# Patient Record
Sex: Female | Born: 1945 | Race: White | Hispanic: No | Marital: Married | Smoking: Former smoker
Health system: Southern US, Community
[De-identification: ages and names within clinical notes are randomized; demographics above are authoritative.]

## PROBLEM LIST (undated history)

## (undated) DIAGNOSIS — D369 Benign neoplasm, unspecified site: Secondary | ICD-10-CM

## (undated) DIAGNOSIS — T7840XA Allergy, unspecified, initial encounter: Secondary | ICD-10-CM

## (undated) DIAGNOSIS — E785 Hyperlipidemia, unspecified: Secondary | ICD-10-CM

## (undated) DIAGNOSIS — D649 Anemia, unspecified: Secondary | ICD-10-CM

## (undated) DIAGNOSIS — I10 Essential (primary) hypertension: Secondary | ICD-10-CM

## (undated) DIAGNOSIS — Z8616 Personal history of COVID-19: Secondary | ICD-10-CM

## (undated) DIAGNOSIS — R7303 Prediabetes: Secondary | ICD-10-CM

## (undated) DIAGNOSIS — K219 Gastro-esophageal reflux disease without esophagitis: Secondary | ICD-10-CM

## (undated) DIAGNOSIS — Z780 Asymptomatic menopausal state: Secondary | ICD-10-CM

## (undated) DIAGNOSIS — M1990 Unspecified osteoarthritis, unspecified site: Secondary | ICD-10-CM

## (undated) DIAGNOSIS — Z86010 Personal history of colonic polyps: Secondary | ICD-10-CM

## (undated) DIAGNOSIS — K589 Irritable bowel syndrome without diarrhea: Secondary | ICD-10-CM

## (undated) HISTORY — PX: POLYPECTOMY: SHX149

## (undated) HISTORY — DX: Essential (primary) hypertension: I10

## (undated) HISTORY — PX: OTHER SURGICAL HISTORY: SHX169

## (undated) HISTORY — DX: Hyperlipidemia, unspecified: E78.5

## (undated) HISTORY — DX: Personal history of colonic polyps: Z86.010

## (undated) HISTORY — DX: Gastro-esophageal reflux disease without esophagitis: K21.9

## (undated) HISTORY — DX: Allergy, unspecified, initial encounter: T78.40XA

## (undated) HISTORY — DX: Irritable bowel syndrome without diarrhea: K58.9

## (undated) HISTORY — PX: CATARACT EXTRACTION, BILATERAL: SHX1313

## (undated) HISTORY — DX: Benign neoplasm, unspecified site: D36.9

## (undated) HISTORY — DX: Anemia, unspecified: D64.9

## (undated) HISTORY — DX: Unspecified osteoarthritis, unspecified site: M19.90

## (undated) HISTORY — DX: Asymptomatic menopausal state: Z78.0

## (undated) HISTORY — PX: CHOLECYSTECTOMY: SHX55

## (undated) HISTORY — PX: BREAST BIOPSY: SHX20

## (undated) HISTORY — PX: ABDOMINAL HYSTERECTOMY: SHX81

## (undated) HISTORY — PX: COLONOSCOPY: SHX174

---

## 2003-11-24 ENCOUNTER — Ambulatory Visit: Payer: Self-pay | Admitting: Family Medicine

## 2003-12-24 ENCOUNTER — Ambulatory Visit: Payer: Self-pay | Admitting: Family Medicine

## 2004-03-14 ENCOUNTER — Ambulatory Visit: Payer: Self-pay | Admitting: Family Medicine

## 2004-03-18 ENCOUNTER — Ambulatory Visit: Payer: Self-pay | Admitting: Family Medicine

## 2004-04-28 ENCOUNTER — Ambulatory Visit: Payer: Self-pay | Admitting: Family Medicine

## 2004-07-15 ENCOUNTER — Ambulatory Visit: Payer: Self-pay | Admitting: Family Medicine

## 2004-08-15 ENCOUNTER — Ambulatory Visit: Payer: Self-pay | Admitting: Family Medicine

## 2005-02-06 ENCOUNTER — Ambulatory Visit: Payer: Self-pay | Admitting: Family Medicine

## 2015-03-31 DIAGNOSIS — M1990 Unspecified osteoarthritis, unspecified site: Secondary | ICD-10-CM | POA: Insufficient documentation

## 2015-03-31 DIAGNOSIS — I10 Essential (primary) hypertension: Secondary | ICD-10-CM | POA: Insufficient documentation

## 2015-03-31 DIAGNOSIS — E785 Hyperlipidemia, unspecified: Secondary | ICD-10-CM | POA: Insufficient documentation

## 2015-03-31 DIAGNOSIS — R7301 Impaired fasting glucose: Secondary | ICD-10-CM | POA: Insufficient documentation

## 2015-03-31 DIAGNOSIS — E559 Vitamin D deficiency, unspecified: Secondary | ICD-10-CM | POA: Insufficient documentation

## 2015-03-31 DIAGNOSIS — K2100 Gastro-esophageal reflux disease with esophagitis, without bleeding: Secondary | ICD-10-CM | POA: Insufficient documentation

## 2016-01-17 HISTORY — PX: COLONOSCOPY: SHX174

## 2016-05-11 DIAGNOSIS — N393 Stress incontinence (female) (male): Secondary | ICD-10-CM | POA: Insufficient documentation

## 2017-03-21 DIAGNOSIS — M25552 Pain in left hip: Secondary | ICD-10-CM | POA: Insufficient documentation

## 2018-12-24 ENCOUNTER — Encounter: Payer: Self-pay | Admitting: Gastroenterology

## 2019-01-07 ENCOUNTER — Encounter: Payer: Self-pay | Admitting: Gastroenterology

## 2019-01-20 DIAGNOSIS — Z20828 Contact with and (suspected) exposure to other viral communicable diseases: Secondary | ICD-10-CM | POA: Diagnosis not present

## 2019-01-20 DIAGNOSIS — R5383 Other fatigue: Secondary | ICD-10-CM | POA: Diagnosis not present

## 2019-01-20 DIAGNOSIS — R519 Headache, unspecified: Secondary | ICD-10-CM | POA: Diagnosis not present

## 2019-01-20 DIAGNOSIS — J3489 Other specified disorders of nose and nasal sinuses: Secondary | ICD-10-CM | POA: Diagnosis not present

## 2019-02-04 ENCOUNTER — Encounter: Payer: Self-pay | Admitting: Gastroenterology

## 2019-03-06 ENCOUNTER — Other Ambulatory Visit: Payer: Self-pay

## 2019-03-06 ENCOUNTER — Ambulatory Visit (AMBULATORY_SURGERY_CENTER): Payer: Medicare PPO | Admitting: *Deleted

## 2019-03-06 VITALS — Ht 67.0 in | Wt 191.0 lb

## 2019-03-06 DIAGNOSIS — Z86010 Personal history of colonic polyps: Secondary | ICD-10-CM

## 2019-03-06 MED ORDER — SUPREP BOWEL PREP KIT 17.5-3.13-1.6 GM/177ML PO SOLN: 1.0000 | Freq: Once | ORAL | 0 refills | Status: AC

## 2019-03-06 NOTE — Progress Notes (Signed)
Completed covid vaccines 03-04-2019- will be 2 weeks 1 day prior to colon 3-17 - she will not require a pre screen covid test   Pt verified name, DOB, address and insurance during PV today. Pt mailed instruction packet to included paper to complete and mail back to [facility] with addressed and stamped envelope, Emmi video, copy of consent form to read and not return, and instructions. PV completed over the phone. Pt encouraged to call with questions or issues   No egg or soy allergy known to patient  issues with past sedation with any surgeries  or procedures- hard to wake post op  , no intubation problems  No diet pills per patient No home 02 use per patient  No blood thinners per patient  Pt states  issues with constipation - she had a fair prep -  She said dr Lyndel Safe told her that she was hard to clean out last colon so will do a  2 day prep today for colon 3-17 No A fib or A flutter  EMMI video sent to pt's e mail   Due to the COVID-19 pandemic we are asking patients to follow these guidelines. Please only bring one care partner. Please be aware that your care partner may wait in the car in the parking lot or if they feel like they will be too hot to wait in the car, they may wait in the lobby on the 4th floor. All care partners are required to wear a mask the entire time (we do not have any that we can provide them), they need to practice social distancing, and we will do a Covid check for all patient's and care partners when you arrive. Also we will check their temperature and your temperature. If the care partner waits in their car they need to stay in the parking lot the entire time and we will call them on their cell phone when the patient is ready for discharge so they can bring the car to the front of the building. Also all patient's will need to wear a mask into building.

## 2019-03-12 ENCOUNTER — Encounter: Payer: Self-pay | Admitting: Gastroenterology

## 2019-03-19 ENCOUNTER — Other Ambulatory Visit: Payer: Self-pay

## 2019-03-19 ENCOUNTER — Encounter: Payer: Self-pay | Admitting: Gastroenterology

## 2019-03-19 ENCOUNTER — Ambulatory Visit (AMBULATORY_SURGERY_CENTER): Payer: Medicare PPO | Admitting: Gastroenterology

## 2019-03-19 VITALS — BP 120/56 | HR 50 | Temp 97.3°F | Resp 13 | Ht 67.0 in | Wt 191.0 lb

## 2019-03-19 DIAGNOSIS — Z1211 Encounter for screening for malignant neoplasm of colon: Secondary | ICD-10-CM | POA: Diagnosis not present

## 2019-03-19 DIAGNOSIS — D124 Benign neoplasm of descending colon: Secondary | ICD-10-CM

## 2019-03-19 DIAGNOSIS — I10 Essential (primary) hypertension: Secondary | ICD-10-CM | POA: Diagnosis not present

## 2019-03-19 DIAGNOSIS — Z86010 Personal history of colonic polyps: Secondary | ICD-10-CM | POA: Diagnosis not present

## 2019-03-19 DIAGNOSIS — D120 Benign neoplasm of cecum: Secondary | ICD-10-CM

## 2019-03-19 DIAGNOSIS — K589 Irritable bowel syndrome without diarrhea: Secondary | ICD-10-CM | POA: Diagnosis not present

## 2019-03-19 DIAGNOSIS — K219 Gastro-esophageal reflux disease without esophagitis: Secondary | ICD-10-CM | POA: Diagnosis not present

## 2019-03-19 DIAGNOSIS — D122 Benign neoplasm of ascending colon: Secondary | ICD-10-CM

## 2019-03-19 MED ORDER — SODIUM CHLORIDE 0.9 % IV SOLN: 500.0000 mL | Freq: Once | INTRAVENOUS | Status: DC

## 2019-03-19 NOTE — Op Note (Signed)
[facility] Patient Name: Amanda Merritt Procedure Date: 03/19/2019 11:00 AM MRN: [id number] Endoscopist: Jackquline Denmark , MD Age: 74 Referring MD:  Date of Birth: 08-02-1945 Gender: Female Account #: [id number] Procedure:                Colonoscopy Indications:              High risk colon cancer surveillance: Personal                            history of colonic polyps Medicines:                Monitored Anesthesia Care Procedure:                Pre-Anesthesia Assessment:                           - Prior to the procedure, a History and Physical                            was performed, and patient medications and                            allergies were reviewed. The patient's tolerance of                            previous anesthesia was also reviewed. The risks                            and benefits of the procedure and the sedation                            options and risks were discussed with the patient.                            All questions were answered, and informed consent                            was obtained. Prior Anticoagulants: The patient has                            taken no previous anticoagulant or antiplatelet                            agents. ASA Grade Assessment: II - A patient with                            mild systemic disease. After reviewing the risks                            and benefits, the patient was deemed in                            satisfactory condition to undergo the procedure.  After obtaining informed consent, the colonoscope                            was passed under direct vision. Throughout the                            procedure, the patient's blood pressure, pulse, and                            oxygen saturations were monitored continuously. The                            Colonoscope PCF H180 was introduced through the                            anus and advanced to the 2 cm into the  ileum. The                            colonoscopy was performed without difficulty. The                            patient tolerated the procedure well. The quality                            of the bowel preparation was good (2 day prep). The                            terminal ileum, ileocecal valve, appendiceal                            orifice, and rectum were photographed. Scope In: 11:09:52 AM Scope Out: 11:28:46 AM Scope Withdrawal Time: 0 hours 16 minutes 5 seconds  Total Procedure Duration: 0 hours 18 minutes 54 seconds  Findings:                 Four sessile polyps were found in the descending                            colon, ascending colon and cecum. The polyps were 6                            to 8 mm in size. These polyps were removed with a                            cold snare. Resection and retrieval were complete.                           Multiple small and large-mouthed diverticula were                            found in the sigmoid colon, few in descending colon  and ascending colon.                           Non-bleeding internal hemorrhoids were found during                            retroflexion. The hemorrhoids were small.                           The terminal ileum appeared normal.                           The exam was otherwise without abnormality on                            direct and retroflexion views. Complications:            No immediate complications. Estimated Blood Loss:     Estimated blood loss: none. Impression:               -Colonic polyps s/p polypectomy.                           -Pancolonic diverticulosis predominantly in the                            sigmoid colon.                           -Otherwise normal colonoscopy to TI. Of note that                            patient got 2-day prep. Recommendation:           - Patient has a contact number available for                            emergencies. The signs  and symptoms of potential                            delayed complications were discussed with the                            patient. Return to normal activities tomorrow.                            Written discharge instructions were provided to the                            patient.                           - High fiber diet.                           - Continue present medications.                           -  Await pathology results.                           - Repeat colonoscopy for surveillance based on                            pathology results.                           - Return to GI office PRN. Jackquline Denmark, MD 03/19/2019 11:34:39 AM This report has been signed electronically.

## 2019-03-19 NOTE — Patient Instructions (Signed)
YOU HAD AN ENDOSCOPIC PROCEDURE TODAY AT THE [facility]:   Refer to the procedure report that was given to you for any specific questions about what was found during the examination.  If the procedure report does not answer your questions, please call your gastroenterologist to clarify.  If you requested that your care partner not be given the details of your procedure findings, then the procedure report has been included in a sealed envelope for you to review at your convenience later.  YOU SHOULD EXPECT: Some feelings of bloating in the abdomen. Passage of more gas than usual.  Walking can help get rid of the air that was put into your GI tract during the procedure and reduce the bloating. If you had a lower endoscopy (such as a colonoscopy or flexible sigmoidoscopy) you may notice spotting of blood in your stool or on the toilet paper. If you underwent a bowel prep for your procedure, you may not have a normal bowel movement for a few days.  Please Note:  You might notice some irritation and congestion in your nose or some drainage.  This is from the oxygen used during your procedure.  There is no need for concern and it should clear up in a day or so.  SYMPTOMS TO REPORT IMMEDIATELY:   Following lower endoscopy (colonoscopy or flexible sigmoidoscopy):  Excessive amounts of blood in the stool  Significant tenderness or worsening of abdominal pains  Swelling of the abdomen that is new, acute  Fever of 100F or higher  For urgent or emergent issues, a gastroenterologist can be reached at any hour by calling [phone]. Do not use MyChart messaging for urgent concerns.    DIET:  We do recommend a small meal at first, but then you may proceed to your regular diet.  Drink plenty of fluids but you should avoid alcoholic beverages for 24 hours.  ACTIVITY:  You should plan to take it easy for the rest of today and you should NOT DRIVE or use heavy machinery until tomorrow (because  of the sedation medicines used during the test).    FOLLOW UP: Our staff will call the number listed on your records 48-72 hours following your procedure to check on you and address any questions or concerns that you may have regarding the information given to you following your procedure. If we do not reach you, we will leave a message.  We will attempt to reach you two times.  During this call, we will ask if you have developed any symptoms of COVID 19. If you develop any symptoms (ie: fever, flu-like symptoms, shortness of breath, cough etc.) before then, please call [phone].  If you test positive for Covid 19 in the 2 weeks post procedure, please call and report this information to us.    If any biopsies were taken you will be contacted by phone or by letter within the next 1-3 weeks.  Please call us at [phone] if you have not heard about the biopsies in 3 weeks.    SIGNATURES/CONFIDENTIALITY: You and/or your care partner have signed paperwork which will be entered into your electronic medical record.  These signatures attest to the fact that that the information above on your After Visit Summary has been reviewed and is understood.  Full responsibility of the confidentiality of this discharge information lies with you and/or your care-partner. 

## 2019-03-19 NOTE — Progress Notes (Signed)
Report given to PACU, vss 

## 2019-03-19 NOTE — Progress Notes (Signed)
Pt's states no medical or surgical changes since previsit or office visit. 

## 2019-03-19 NOTE — Progress Notes (Signed)
Called to room to assist during endoscopic procedure.  Patient ID and intended procedure confirmed with present staff. Received instructions for my participation in the procedure from the performing physician.  

## 2019-03-21 ENCOUNTER — Encounter: Payer: Self-pay | Admitting: Gastroenterology

## 2019-03-21 ENCOUNTER — Telehealth: Payer: Self-pay

## 2019-03-21 NOTE — Telephone Encounter (Signed)
  Follow up Call-  Call back number 03/19/2019  Post procedure Call Back phone  # [phone]  Permission to leave phone message Yes  Some recent data might be hidden     Patient questions:  Do you have a fever, pain , or abdominal swelling? No. Pain Score  0 *  Have you tolerated food without any problems? Yes.    Have you been able to return to your normal activities? Yes.    Do you have any questions about your discharge instructions: Diet   No. Medications  No. Follow up visit  No.  Do you have questions or concerns about your Care? No.  Actions: * If pain score is 4 or above: No action needed, pain <4.  1. Have you developed a fever since your procedure? no  2.   Have you had an respiratory symptoms (SOB or cough) since your procedure? no  3.   Have you tested positive for COVID 19 since your procedure no  4.   Have you had any family members/close contacts diagnosed with the COVID 19 since your procedure?  no   If yes to any of these questions please route to Joylene John, RN and Alphonsa Gin, Therapist, sports.

## 2019-04-14 DIAGNOSIS — R7309 Other abnormal glucose: Secondary | ICD-10-CM | POA: Diagnosis not present

## 2019-04-14 DIAGNOSIS — I10 Essential (primary) hypertension: Secondary | ICD-10-CM | POA: Diagnosis not present

## 2019-04-14 DIAGNOSIS — E785 Hyperlipidemia, unspecified: Secondary | ICD-10-CM | POA: Diagnosis not present

## 2019-04-14 DIAGNOSIS — Z0000 Encounter for general adult medical examination without abnormal findings: Secondary | ICD-10-CM | POA: Diagnosis not present

## 2019-04-21 DIAGNOSIS — M19019 Primary osteoarthritis, unspecified shoulder: Secondary | ICD-10-CM | POA: Diagnosis not present

## 2019-04-21 DIAGNOSIS — M5090 Cervical disc disorder, unspecified, unspecified cervical region: Secondary | ICD-10-CM | POA: Diagnosis not present

## 2019-04-21 DIAGNOSIS — R7309 Other abnormal glucose: Secondary | ICD-10-CM | POA: Diagnosis not present

## 2019-04-21 DIAGNOSIS — E785 Hyperlipidemia, unspecified: Secondary | ICD-10-CM | POA: Diagnosis not present

## 2019-04-21 DIAGNOSIS — Z0000 Encounter for general adult medical examination without abnormal findings: Secondary | ICD-10-CM | POA: Diagnosis not present

## 2019-04-21 DIAGNOSIS — K5900 Constipation, unspecified: Secondary | ICD-10-CM | POA: Diagnosis not present

## 2019-04-21 DIAGNOSIS — L989 Disorder of the skin and subcutaneous tissue, unspecified: Secondary | ICD-10-CM | POA: Diagnosis not present

## 2019-04-21 DIAGNOSIS — I10 Essential (primary) hypertension: Secondary | ICD-10-CM | POA: Diagnosis not present

## 2019-05-29 DIAGNOSIS — C44311 Basal cell carcinoma of skin of nose: Secondary | ICD-10-CM | POA: Diagnosis not present

## 2019-05-29 DIAGNOSIS — L821 Other seborrheic keratosis: Secondary | ICD-10-CM | POA: Diagnosis not present

## 2019-05-29 DIAGNOSIS — L578 Other skin changes due to chronic exposure to nonionizing radiation: Secondary | ICD-10-CM | POA: Diagnosis not present

## 2019-09-24 DIAGNOSIS — M5432 Sciatica, left side: Secondary | ICD-10-CM | POA: Diagnosis not present

## 2019-09-24 DIAGNOSIS — M5388 Other specified dorsopathies, sacral and sacrococcygeal region: Secondary | ICD-10-CM | POA: Diagnosis not present

## 2019-09-24 DIAGNOSIS — M9904 Segmental and somatic dysfunction of sacral region: Secondary | ICD-10-CM | POA: Diagnosis not present

## 2019-09-24 DIAGNOSIS — M9905 Segmental and somatic dysfunction of pelvic region: Secondary | ICD-10-CM | POA: Diagnosis not present

## 2019-09-24 DIAGNOSIS — M9903 Segmental and somatic dysfunction of lumbar region: Secondary | ICD-10-CM | POA: Diagnosis not present

## 2019-09-24 DIAGNOSIS — M4727 Other spondylosis with radiculopathy, lumbosacral region: Secondary | ICD-10-CM | POA: Diagnosis not present

## 2019-09-25 DIAGNOSIS — M4727 Other spondylosis with radiculopathy, lumbosacral region: Secondary | ICD-10-CM | POA: Diagnosis not present

## 2019-09-25 DIAGNOSIS — M5432 Sciatica, left side: Secondary | ICD-10-CM | POA: Diagnosis not present

## 2019-09-25 DIAGNOSIS — M9903 Segmental and somatic dysfunction of lumbar region: Secondary | ICD-10-CM | POA: Diagnosis not present

## 2019-09-25 DIAGNOSIS — M5388 Other specified dorsopathies, sacral and sacrococcygeal region: Secondary | ICD-10-CM | POA: Diagnosis not present

## 2019-09-25 DIAGNOSIS — M9904 Segmental and somatic dysfunction of sacral region: Secondary | ICD-10-CM | POA: Diagnosis not present

## 2019-09-25 DIAGNOSIS — M9905 Segmental and somatic dysfunction of pelvic region: Secondary | ICD-10-CM | POA: Diagnosis not present

## 2019-09-29 DIAGNOSIS — M9903 Segmental and somatic dysfunction of lumbar region: Secondary | ICD-10-CM | POA: Diagnosis not present

## 2019-09-29 DIAGNOSIS — M4727 Other spondylosis with radiculopathy, lumbosacral region: Secondary | ICD-10-CM | POA: Diagnosis not present

## 2019-09-29 DIAGNOSIS — M5388 Other specified dorsopathies, sacral and sacrococcygeal region: Secondary | ICD-10-CM | POA: Diagnosis not present

## 2019-09-29 DIAGNOSIS — M9904 Segmental and somatic dysfunction of sacral region: Secondary | ICD-10-CM | POA: Diagnosis not present

## 2019-09-29 DIAGNOSIS — M5432 Sciatica, left side: Secondary | ICD-10-CM | POA: Diagnosis not present

## 2019-09-29 DIAGNOSIS — M9905 Segmental and somatic dysfunction of pelvic region: Secondary | ICD-10-CM | POA: Diagnosis not present

## 2019-10-01 DIAGNOSIS — M9905 Segmental and somatic dysfunction of pelvic region: Secondary | ICD-10-CM | POA: Diagnosis not present

## 2019-10-01 DIAGNOSIS — M9903 Segmental and somatic dysfunction of lumbar region: Secondary | ICD-10-CM | POA: Diagnosis not present

## 2019-10-01 DIAGNOSIS — M5388 Other specified dorsopathies, sacral and sacrococcygeal region: Secondary | ICD-10-CM | POA: Diagnosis not present

## 2019-10-01 DIAGNOSIS — M4727 Other spondylosis with radiculopathy, lumbosacral region: Secondary | ICD-10-CM | POA: Diagnosis not present

## 2019-10-01 DIAGNOSIS — M5432 Sciatica, left side: Secondary | ICD-10-CM | POA: Diagnosis not present

## 2019-10-01 DIAGNOSIS — M9904 Segmental and somatic dysfunction of sacral region: Secondary | ICD-10-CM | POA: Diagnosis not present

## 2019-10-06 DIAGNOSIS — I10 Essential (primary) hypertension: Secondary | ICD-10-CM | POA: Diagnosis not present

## 2019-10-06 DIAGNOSIS — R7309 Other abnormal glucose: Secondary | ICD-10-CM | POA: Diagnosis not present

## 2019-10-06 DIAGNOSIS — E559 Vitamin D deficiency, unspecified: Secondary | ICD-10-CM | POA: Diagnosis not present

## 2019-10-06 DIAGNOSIS — E785 Hyperlipidemia, unspecified: Secondary | ICD-10-CM | POA: Diagnosis not present

## 2019-10-08 DIAGNOSIS — M9904 Segmental and somatic dysfunction of sacral region: Secondary | ICD-10-CM | POA: Diagnosis not present

## 2019-10-08 DIAGNOSIS — M9903 Segmental and somatic dysfunction of lumbar region: Secondary | ICD-10-CM | POA: Diagnosis not present

## 2019-10-08 DIAGNOSIS — M5432 Sciatica, left side: Secondary | ICD-10-CM | POA: Diagnosis not present

## 2019-10-08 DIAGNOSIS — M4727 Other spondylosis with radiculopathy, lumbosacral region: Secondary | ICD-10-CM | POA: Diagnosis not present

## 2019-10-08 DIAGNOSIS — M9905 Segmental and somatic dysfunction of pelvic region: Secondary | ICD-10-CM | POA: Diagnosis not present

## 2019-10-08 DIAGNOSIS — M5388 Other specified dorsopathies, sacral and sacrococcygeal region: Secondary | ICD-10-CM | POA: Diagnosis not present

## 2019-10-13 DIAGNOSIS — M5136 Other intervertebral disc degeneration, lumbar region: Secondary | ICD-10-CM | POA: Diagnosis not present

## 2019-10-13 DIAGNOSIS — M5090 Cervical disc disorder, unspecified, unspecified cervical region: Secondary | ICD-10-CM | POA: Diagnosis not present

## 2019-10-13 DIAGNOSIS — M5388 Other specified dorsopathies, sacral and sacrococcygeal region: Secondary | ICD-10-CM | POA: Diagnosis not present

## 2019-10-13 DIAGNOSIS — R7309 Other abnormal glucose: Secondary | ICD-10-CM | POA: Diagnosis not present

## 2019-10-13 DIAGNOSIS — K5900 Constipation, unspecified: Secondary | ICD-10-CM | POA: Diagnosis not present

## 2019-10-13 DIAGNOSIS — E785 Hyperlipidemia, unspecified: Secondary | ICD-10-CM | POA: Diagnosis not present

## 2019-10-13 DIAGNOSIS — M4727 Other spondylosis with radiculopathy, lumbosacral region: Secondary | ICD-10-CM | POA: Diagnosis not present

## 2019-10-13 DIAGNOSIS — M9904 Segmental and somatic dysfunction of sacral region: Secondary | ICD-10-CM | POA: Diagnosis not present

## 2019-10-13 DIAGNOSIS — E559 Vitamin D deficiency, unspecified: Secondary | ICD-10-CM | POA: Diagnosis not present

## 2019-10-13 DIAGNOSIS — M5410 Radiculopathy, site unspecified: Secondary | ICD-10-CM | POA: Diagnosis not present

## 2019-10-13 DIAGNOSIS — M19019 Primary osteoarthritis, unspecified shoulder: Secondary | ICD-10-CM | POA: Diagnosis not present

## 2019-10-13 DIAGNOSIS — I10 Essential (primary) hypertension: Secondary | ICD-10-CM | POA: Diagnosis not present

## 2019-10-13 DIAGNOSIS — M9905 Segmental and somatic dysfunction of pelvic region: Secondary | ICD-10-CM | POA: Diagnosis not present

## 2019-10-13 DIAGNOSIS — M5432 Sciatica, left side: Secondary | ICD-10-CM | POA: Diagnosis not present

## 2019-10-13 DIAGNOSIS — M9903 Segmental and somatic dysfunction of lumbar region: Secondary | ICD-10-CM | POA: Diagnosis not present

## 2019-10-14 DIAGNOSIS — Z1231 Encounter for screening mammogram for malignant neoplasm of breast: Secondary | ICD-10-CM | POA: Diagnosis not present

## 2019-10-16 DIAGNOSIS — M9905 Segmental and somatic dysfunction of pelvic region: Secondary | ICD-10-CM | POA: Diagnosis not present

## 2019-10-16 DIAGNOSIS — M5388 Other specified dorsopathies, sacral and sacrococcygeal region: Secondary | ICD-10-CM | POA: Diagnosis not present

## 2019-10-16 DIAGNOSIS — M9903 Segmental and somatic dysfunction of lumbar region: Secondary | ICD-10-CM | POA: Diagnosis not present

## 2019-10-16 DIAGNOSIS — M9904 Segmental and somatic dysfunction of sacral region: Secondary | ICD-10-CM | POA: Diagnosis not present

## 2019-10-16 DIAGNOSIS — M5432 Sciatica, left side: Secondary | ICD-10-CM | POA: Diagnosis not present

## 2019-10-16 DIAGNOSIS — M4727 Other spondylosis with radiculopathy, lumbosacral region: Secondary | ICD-10-CM | POA: Diagnosis not present

## 2019-12-16 DIAGNOSIS — H2512 Age-related nuclear cataract, left eye: Secondary | ICD-10-CM | POA: Diagnosis not present

## 2019-12-16 DIAGNOSIS — H18413 Arcus senilis, bilateral: Secondary | ICD-10-CM | POA: Diagnosis not present

## 2019-12-16 DIAGNOSIS — H25043 Posterior subcapsular polar age-related cataract, bilateral: Secondary | ICD-10-CM | POA: Diagnosis not present

## 2019-12-16 DIAGNOSIS — H35372 Puckering of macula, left eye: Secondary | ICD-10-CM | POA: Diagnosis not present

## 2019-12-16 DIAGNOSIS — H2513 Age-related nuclear cataract, bilateral: Secondary | ICD-10-CM | POA: Diagnosis not present

## 2019-12-16 DIAGNOSIS — H25013 Cortical age-related cataract, bilateral: Secondary | ICD-10-CM | POA: Diagnosis not present

## 2020-01-14 DIAGNOSIS — M19019 Primary osteoarthritis, unspecified shoulder: Secondary | ICD-10-CM | POA: Diagnosis not present

## 2020-01-14 DIAGNOSIS — I10 Essential (primary) hypertension: Secondary | ICD-10-CM | POA: Diagnosis not present

## 2020-01-14 DIAGNOSIS — M5090 Cervical disc disorder, unspecified, unspecified cervical region: Secondary | ICD-10-CM | POA: Diagnosis not present

## 2020-01-14 DIAGNOSIS — M47812 Spondylosis without myelopathy or radiculopathy, cervical region: Secondary | ICD-10-CM | POA: Diagnosis not present

## 2020-01-14 DIAGNOSIS — M19011 Primary osteoarthritis, right shoulder: Secondary | ICD-10-CM | POA: Diagnosis not present

## 2020-01-14 DIAGNOSIS — M79601 Pain in right arm: Secondary | ICD-10-CM | POA: Diagnosis not present

## 2020-01-14 DIAGNOSIS — M25511 Pain in right shoulder: Secondary | ICD-10-CM | POA: Diagnosis not present

## 2020-01-26 DIAGNOSIS — H2512 Age-related nuclear cataract, left eye: Secondary | ICD-10-CM | POA: Diagnosis not present

## 2020-01-27 DIAGNOSIS — M25511 Pain in right shoulder: Secondary | ICD-10-CM | POA: Diagnosis not present

## 2020-01-27 DIAGNOSIS — M19019 Primary osteoarthritis, unspecified shoulder: Secondary | ICD-10-CM | POA: Diagnosis not present

## 2020-01-27 DIAGNOSIS — H2511 Age-related nuclear cataract, right eye: Secondary | ICD-10-CM | POA: Diagnosis not present

## 2020-01-27 DIAGNOSIS — I10 Essential (primary) hypertension: Secondary | ICD-10-CM | POA: Diagnosis not present

## 2020-02-09 ENCOUNTER — Other Ambulatory Visit: Payer: Self-pay | Admitting: Family Medicine

## 2020-02-09 DIAGNOSIS — M25511 Pain in right shoulder: Secondary | ICD-10-CM

## 2020-02-09 DIAGNOSIS — M19019 Primary osteoarthritis, unspecified shoulder: Secondary | ICD-10-CM

## 2020-02-16 DIAGNOSIS — H2511 Age-related nuclear cataract, right eye: Secondary | ICD-10-CM | POA: Diagnosis not present

## 2020-03-02 ENCOUNTER — Other Ambulatory Visit: Payer: Medicare PPO

## 2020-03-05 ENCOUNTER — Other Ambulatory Visit: Payer: Medicare PPO

## 2020-03-20 ENCOUNTER — Ambulatory Visit
Admission: RE | Admit: 2020-03-20 | Discharge: 2020-03-20 | Disposition: A | Discharge status: Home / Self Care | Payer: Medicare PPO | Source: Ambulatory Visit | Attending: Family Medicine | Admitting: Family Medicine

## 2020-03-20 ENCOUNTER — Other Ambulatory Visit: Payer: Self-pay

## 2020-03-20 DIAGNOSIS — M19019 Primary osteoarthritis, unspecified shoulder: Secondary | ICD-10-CM

## 2020-03-20 DIAGNOSIS — S46011A Strain of muscle(s) and tendon(s) of the rotator cuff of right shoulder, initial encounter: Secondary | ICD-10-CM | POA: Diagnosis not present

## 2020-03-20 DIAGNOSIS — M25511 Pain in right shoulder: Secondary | ICD-10-CM

## 2020-03-20 MED ORDER — GADOBENATE DIMEGLUMINE 529 MG/ML IV SOLN
19.0000 mL | Freq: Once | INTRAVENOUS | Status: AC | PRN
  Administered 2020-03-20: 19 mL via INTRAVENOUS

## 2020-04-13 DIAGNOSIS — S46011A Strain of muscle(s) and tendon(s) of the rotator cuff of right shoulder, initial encounter: Secondary | ICD-10-CM | POA: Diagnosis not present

## 2020-04-16 DIAGNOSIS — E785 Hyperlipidemia, unspecified: Secondary | ICD-10-CM | POA: Diagnosis not present

## 2020-04-16 DIAGNOSIS — E559 Vitamin D deficiency, unspecified: Secondary | ICD-10-CM | POA: Diagnosis not present

## 2020-04-16 DIAGNOSIS — R7309 Other abnormal glucose: Secondary | ICD-10-CM | POA: Diagnosis not present

## 2020-04-16 DIAGNOSIS — I10 Essential (primary) hypertension: Secondary | ICD-10-CM | POA: Diagnosis not present

## 2020-04-16 DIAGNOSIS — R946 Abnormal results of thyroid function studies: Secondary | ICD-10-CM | POA: Diagnosis not present

## 2020-04-22 DIAGNOSIS — J3089 Other allergic rhinitis: Secondary | ICD-10-CM | POA: Diagnosis not present

## 2020-04-22 DIAGNOSIS — Z789 Other specified health status: Secondary | ICD-10-CM | POA: Diagnosis not present

## 2020-04-22 DIAGNOSIS — Z0000 Encounter for general adult medical examination without abnormal findings: Secondary | ICD-10-CM | POA: Diagnosis not present

## 2020-04-22 DIAGNOSIS — M7581 Other shoulder lesions, right shoulder: Secondary | ICD-10-CM | POA: Diagnosis not present

## 2020-04-22 DIAGNOSIS — M75100 Unspecified rotator cuff tear or rupture of unspecified shoulder, not specified as traumatic: Secondary | ICD-10-CM | POA: Diagnosis not present

## 2020-04-22 DIAGNOSIS — E785 Hyperlipidemia, unspecified: Secondary | ICD-10-CM | POA: Diagnosis not present

## 2020-04-22 DIAGNOSIS — R7309 Other abnormal glucose: Secondary | ICD-10-CM | POA: Diagnosis not present

## 2020-04-22 DIAGNOSIS — I10 Essential (primary) hypertension: Secondary | ICD-10-CM | POA: Diagnosis not present

## 2020-04-29 ENCOUNTER — Other Ambulatory Visit: Payer: Self-pay

## 2020-04-29 ENCOUNTER — Encounter (HOSPITAL_BASED_OUTPATIENT_CLINIC_OR_DEPARTMENT_OTHER): Payer: Self-pay | Admitting: Orthopaedic Surgery

## 2020-05-03 ENCOUNTER — Encounter (HOSPITAL_BASED_OUTPATIENT_CLINIC_OR_DEPARTMENT_OTHER)
Admission: RE | Admit: 2020-05-03 | Discharge: 2020-05-03 | Disposition: A | Discharge status: Home / Self Care | Payer: Medicare PPO | Source: Ambulatory Visit | Attending: Orthopaedic Surgery | Admitting: Orthopaedic Surgery

## 2020-05-03 ENCOUNTER — Other Ambulatory Visit (HOSPITAL_COMMUNITY)
Admission: RE | Admit: 2020-05-03 | Discharge: 2020-05-03 | Disposition: A | Discharge status: Home / Self Care | Payer: Medicare PPO | Source: Ambulatory Visit | Attending: Orthopaedic Surgery | Admitting: Orthopaedic Surgery

## 2020-05-03 DIAGNOSIS — Z20822 Contact with and (suspected) exposure to covid-19: Secondary | ICD-10-CM | POA: Insufficient documentation

## 2020-05-03 DIAGNOSIS — Z888 Allergy status to other drugs, medicaments and biological substances status: Secondary | ICD-10-CM | POA: Diagnosis not present

## 2020-05-03 DIAGNOSIS — K582 Mixed irritable bowel syndrome: Secondary | ICD-10-CM | POA: Diagnosis not present

## 2020-05-03 DIAGNOSIS — Z87891 Personal history of nicotine dependence: Secondary | ICD-10-CM | POA: Diagnosis not present

## 2020-05-03 DIAGNOSIS — G8929 Other chronic pain: Secondary | ICD-10-CM | POA: Diagnosis not present

## 2020-05-03 DIAGNOSIS — M25511 Pain in right shoulder: Secondary | ICD-10-CM | POA: Diagnosis not present

## 2020-05-03 DIAGNOSIS — Z01810 Encounter for preprocedural cardiovascular examination: Secondary | ICD-10-CM | POA: Insufficient documentation

## 2020-05-03 DIAGNOSIS — I10 Essential (primary) hypertension: Secondary | ICD-10-CM | POA: Diagnosis not present

## 2020-05-03 DIAGNOSIS — Z800 Family history of malignant neoplasm of digestive organs: Secondary | ICD-10-CM | POA: Diagnosis not present

## 2020-05-03 DIAGNOSIS — Z8616 Personal history of COVID-19: Secondary | ICD-10-CM | POA: Diagnosis not present

## 2020-05-03 DIAGNOSIS — S46011A Strain of muscle(s) and tendon(s) of the rotator cuff of right shoulder, initial encounter: Secondary | ICD-10-CM | POA: Diagnosis not present

## 2020-05-03 DIAGNOSIS — Z79899 Other long term (current) drug therapy: Secondary | ICD-10-CM | POA: Diagnosis not present

## 2020-05-03 DIAGNOSIS — Z01812 Encounter for preprocedural laboratory examination: Secondary | ICD-10-CM | POA: Diagnosis not present

## 2020-05-03 DIAGNOSIS — Y939 Activity, unspecified: Secondary | ICD-10-CM | POA: Diagnosis not present

## 2020-05-03 DIAGNOSIS — M19011 Primary osteoarthritis, right shoulder: Secondary | ICD-10-CM | POA: Diagnosis not present

## 2020-05-03 DIAGNOSIS — K219 Gastro-esophageal reflux disease without esophagitis: Secondary | ICD-10-CM | POA: Diagnosis not present

## 2020-05-03 DIAGNOSIS — W19XXXA Unspecified fall, initial encounter: Secondary | ICD-10-CM | POA: Diagnosis not present

## 2020-05-03 DIAGNOSIS — R7303 Prediabetes: Secondary | ICD-10-CM | POA: Diagnosis not present

## 2020-05-03 DIAGNOSIS — Z885 Allergy status to narcotic agent status: Secondary | ICD-10-CM | POA: Diagnosis not present

## 2020-05-03 DIAGNOSIS — Z7982 Long term (current) use of aspirin: Secondary | ICD-10-CM | POA: Diagnosis not present

## 2020-05-03 DIAGNOSIS — Z803 Family history of malignant neoplasm of breast: Secondary | ICD-10-CM | POA: Diagnosis not present

## 2020-05-03 LAB — BASIC METABOLIC PANEL
Anion gap: 7 (ref 5–15)
BUN: 16 mg/dL (ref 8–23)
CO2: 29 mmol/L (ref 22–32)
Calcium: 9.4 mg/dL (ref 8.9–10.3)
Chloride: 101 mmol/L (ref 98–111)
Creatinine, Ser: 0.74 mg/dL (ref 0.44–1.00)
GFR, Estimated: >60 mL/min (ref >60)
Glucose, Bld: 118 mg/dL — ABNORMAL HIGH (ref 70–99)
Potassium: 3.8 mmol/L (ref 3.5–5.1)
Sodium: 137 mmol/L (ref 135–145)

## 2020-05-03 LAB — SURGICAL PCR SCREEN
MRSA, PCR: NEGATIVE
Staphylococcus aureus: POSITIVE — AB

## 2020-05-03 NOTE — Progress Notes (Signed)
      Enhanced Recovery after Surgery for Orthopedics Enhanced Recovery after Surgery is a protocol used to improve the stress on your body and your recovery after surgery.  Patient Instructions  . The night before surgery:  o No food after midnight. ONLY clear liquids after midnight  . The day of surgery (if you do NOT have diabetes):  o Drink ONE (1) Pre-Surgery Clear Ensure as directed.   o This drink was given to you during your hospital  pre-op appointment visit. o The pre-op nurse will instruct you on the time to drink the  Pre-Surgery Ensure depending on your surgery time. o Finish the drink at the designated time by the pre-op nurse.  o Nothing else to drink after completing the  Pre-Surgery Clear Ensure.  . The day of surgery (if you have diabetes): o Drink ONE (1) Gatorade 2 (G2) as directed. o This drink was given to you during your hospital  pre-op appointment visit.  o The pre-op nurse will instruct you on the time to drink the   Gatorade 2 (G2) depending on your surgery time. o Color of the Gatorade may vary. Red is not allowed. o Nothing else to drink after completing the  Gatorade 2 (G2).         If you have questions, please contact your surgeon's office. Surgical soap given with instructions, pt verbalized understanding. Benzoyl peroxide gel given with instructions, pt verbalized understanding. 

## 2020-05-04 LAB — SARS CORONAVIRUS 2 (TAT 6-24 HRS): SARS Coronavirus 2: NEGATIVE

## 2020-05-04 NOTE — Progress Notes (Signed)
Staph +, Left message for Sherri at Dr. Rich Fuchs office.

## 2020-05-05 ENCOUNTER — Ambulatory Visit (HOSPITAL_COMMUNITY): Payer: Medicare PPO

## 2020-05-05 ENCOUNTER — Ambulatory Visit (HOSPITAL_BASED_OUTPATIENT_CLINIC_OR_DEPARTMENT_OTHER)
Admission: RE | Admit: 2020-05-05 | Discharge: 2020-05-05 | Disposition: A | Discharge status: Home / Self Care | Payer: Medicare PPO | Attending: Orthopaedic Surgery | Admitting: Orthopaedic Surgery

## 2020-05-05 ENCOUNTER — Ambulatory Visit (HOSPITAL_BASED_OUTPATIENT_CLINIC_OR_DEPARTMENT_OTHER): Payer: Medicare PPO | Admitting: Certified Registered Nurse Anesthetist

## 2020-05-05 ENCOUNTER — Encounter (HOSPITAL_BASED_OUTPATIENT_CLINIC_OR_DEPARTMENT_OTHER)
Admission: RE | Disposition: A | Discharge status: Home / Self Care | Payer: Self-pay | Source: Home / Self Care | Attending: Orthopaedic Surgery

## 2020-05-05 ENCOUNTER — Encounter (HOSPITAL_BASED_OUTPATIENT_CLINIC_OR_DEPARTMENT_OTHER): Payer: Self-pay | Admitting: Orthopaedic Surgery

## 2020-05-05 ENCOUNTER — Other Ambulatory Visit: Payer: Self-pay

## 2020-05-05 DIAGNOSIS — M19011 Primary osteoarthritis, right shoulder: Secondary | ICD-10-CM | POA: Insufficient documentation

## 2020-05-05 DIAGNOSIS — Z87891 Personal history of nicotine dependence: Secondary | ICD-10-CM | POA: Insufficient documentation

## 2020-05-05 DIAGNOSIS — R7303 Prediabetes: Secondary | ICD-10-CM | POA: Insufficient documentation

## 2020-05-05 DIAGNOSIS — M25511 Pain in right shoulder: Secondary | ICD-10-CM | POA: Insufficient documentation

## 2020-05-05 DIAGNOSIS — Z79899 Other long term (current) drug therapy: Secondary | ICD-10-CM | POA: Insufficient documentation

## 2020-05-05 DIAGNOSIS — G8929 Other chronic pain: Secondary | ICD-10-CM | POA: Insufficient documentation

## 2020-05-05 DIAGNOSIS — W19XXXA Unspecified fall, initial encounter: Secondary | ICD-10-CM | POA: Insufficient documentation

## 2020-05-05 DIAGNOSIS — S46011A Strain of muscle(s) and tendon(s) of the rotator cuff of right shoulder, initial encounter: Secondary | ICD-10-CM | POA: Diagnosis not present

## 2020-05-05 DIAGNOSIS — Z471 Aftercare following joint replacement surgery: Secondary | ICD-10-CM | POA: Diagnosis not present

## 2020-05-05 DIAGNOSIS — Z96611 Presence of right artificial shoulder joint: Secondary | ICD-10-CM | POA: Diagnosis not present

## 2020-05-05 DIAGNOSIS — Z803 Family history of malignant neoplasm of breast: Secondary | ICD-10-CM | POA: Insufficient documentation

## 2020-05-05 DIAGNOSIS — Z09 Encounter for follow-up examination after completed treatment for conditions other than malignant neoplasm: Secondary | ICD-10-CM

## 2020-05-05 DIAGNOSIS — Z7982 Long term (current) use of aspirin: Secondary | ICD-10-CM | POA: Insufficient documentation

## 2020-05-05 DIAGNOSIS — Z8616 Personal history of COVID-19: Secondary | ICD-10-CM | POA: Insufficient documentation

## 2020-05-05 DIAGNOSIS — Z888 Allergy status to other drugs, medicaments and biological substances status: Secondary | ICD-10-CM | POA: Insufficient documentation

## 2020-05-05 DIAGNOSIS — I10 Essential (primary) hypertension: Secondary | ICD-10-CM | POA: Diagnosis not present

## 2020-05-05 DIAGNOSIS — Z885 Allergy status to narcotic agent status: Secondary | ICD-10-CM | POA: Insufficient documentation

## 2020-05-05 DIAGNOSIS — Y939 Activity, unspecified: Secondary | ICD-10-CM | POA: Insufficient documentation

## 2020-05-05 DIAGNOSIS — K582 Mixed irritable bowel syndrome: Secondary | ICD-10-CM | POA: Diagnosis not present

## 2020-05-05 DIAGNOSIS — G8918 Other acute postprocedural pain: Secondary | ICD-10-CM | POA: Diagnosis not present

## 2020-05-05 DIAGNOSIS — K219 Gastro-esophageal reflux disease without esophagitis: Secondary | ICD-10-CM | POA: Insufficient documentation

## 2020-05-05 DIAGNOSIS — E785 Hyperlipidemia, unspecified: Secondary | ICD-10-CM | POA: Diagnosis not present

## 2020-05-05 DIAGNOSIS — Z800 Family history of malignant neoplasm of digestive organs: Secondary | ICD-10-CM | POA: Insufficient documentation

## 2020-05-05 HISTORY — DX: Prediabetes: R73.03

## 2020-05-05 HISTORY — DX: Personal history of COVID-19: Z86.16

## 2020-05-05 HISTORY — PX: REVERSE SHOULDER ARTHROPLASTY: SHX5054

## 2020-05-05 SURGERY — ARTHROPLASTY, SHOULDER, TOTAL, REVERSE
Anesthesia: General | Site: Shoulder | Laterality: Right

## 2020-05-05 MED ORDER — TRANEXAMIC ACID-NACL 1000-0.7 MG/100ML-% IV SOLN
INTRAVENOUS | Status: AC
  Filled 2020-05-05: qty 100

## 2020-05-05 MED ORDER — PROMETHAZINE HCL 25 MG/ML IJ SOLN
6.2500 mg | INTRAMUSCULAR | Status: DC | PRN
  Administered 2020-05-05: 6.25 mg via INTRAVENOUS

## 2020-05-05 MED ORDER — LIDOCAINE HCL (CARDIAC) PF 100 MG/5ML IV SOSY
PREFILLED_SYRINGE | INTRAVENOUS | Status: DC | PRN
  Administered 2020-05-05: 80 mg via INTRAVENOUS

## 2020-05-05 MED ORDER — OMEPRAZOLE 20 MG PO CPDR: 20.0000 mg | DELAYED_RELEASE_CAPSULE | Freq: Every day | ORAL | 0 refills | Status: DC

## 2020-05-05 MED ORDER — FENTANYL CITRATE (PF) 100 MCG/2ML IJ SOLN: 25.0000 ug | INTRAMUSCULAR | Status: DC | PRN

## 2020-05-05 MED ORDER — VANCOMYCIN HCL IN DEXTROSE 1-5 GM/200ML-% IV SOLN
1000.0000 mg | INTRAVENOUS | Status: AC
  Administered 2020-05-05: 1000 mg via INTRAVENOUS

## 2020-05-05 MED ORDER — VANCOMYCIN HCL 1000 MG IV SOLR
INTRAVENOUS | Status: DC | PRN
  Administered 2020-05-05: 1000 mg via INTRAVENOUS

## 2020-05-05 MED ORDER — PHENYLEPHRINE HCL (PRESSORS) 10 MG/ML IV SOLN
INTRAVENOUS | Status: DC | PRN
  Administered 2020-05-05 (×3): 80 ug via INTRAVENOUS

## 2020-05-05 MED ORDER — SODIUM CHLORIDE 0.9 % IV SOLN
INTRAVENOUS | Status: AC | PRN
  Administered 2020-05-05: 1000 mL

## 2020-05-05 MED ORDER — ACETAMINOPHEN 500 MG PO TABS: 1000.0000 mg | ORAL_TABLET | Freq: Three times a day (TID) | ORAL | 0 refills | Status: AC

## 2020-05-05 MED ORDER — BUPIVACAINE LIPOSOME 1.3 % IJ SUSP
INTRAMUSCULAR | Status: DC | PRN
  Administered 2020-05-05: 10 mL via PERINEURAL

## 2020-05-05 MED ORDER — LACTATED RINGERS IV SOLN: INTRAVENOUS | Status: DC

## 2020-05-05 MED ORDER — PROPOFOL 10 MG/ML IV BOLUS
INTRAVENOUS | Status: DC | PRN
  Administered 2020-05-05: 150 mg via INTRAVENOUS

## 2020-05-05 MED ORDER — TRANEXAMIC ACID-NACL 1000-0.7 MG/100ML-% IV SOLN
1000.0000 mg | INTRAVENOUS | Status: AC
  Administered 2020-05-05: 1000 mg via INTRAVENOUS

## 2020-05-05 MED ORDER — AMISULPRIDE (ANTIEMETIC) 5 MG/2ML IV SOLN
INTRAVENOUS | Status: AC
  Filled 2020-05-05: qty 4

## 2020-05-05 MED ORDER — DEXAMETHASONE SODIUM PHOSPHATE 4 MG/ML IJ SOLN
INTRAMUSCULAR | Status: DC | PRN
  Administered 2020-05-05: 5 mg via INTRAVENOUS

## 2020-05-05 MED ORDER — ROCURONIUM BROMIDE 100 MG/10ML IV SOLN
INTRAVENOUS | Status: DC | PRN
  Administered 2020-05-05: 50 mg via INTRAVENOUS

## 2020-05-05 MED ORDER — LACTATED RINGERS IV BOLUS: 250.0000 mL | Freq: Once | INTRAVENOUS | Status: DC

## 2020-05-05 MED ORDER — ACETAMINOPHEN 500 MG PO TABS
1000.0000 mg | ORAL_TABLET | Freq: Once | ORAL | Status: AC
  Administered 2020-05-05: 1000 mg via ORAL

## 2020-05-05 MED ORDER — DEXAMETHASONE SODIUM PHOSPHATE 10 MG/ML IJ SOLN
INTRAMUSCULAR | Status: AC
  Filled 2020-05-05: qty 1

## 2020-05-05 MED ORDER — CEFAZOLIN SODIUM-DEXTROSE 2-4 GM/100ML-% IV SOLN
INTRAVENOUS | Status: AC
  Filled 2020-05-05: qty 100

## 2020-05-05 MED ORDER — PROPOFOL 10 MG/ML IV BOLUS
INTRAVENOUS | Status: AC
  Filled 2020-05-05: qty 20

## 2020-05-05 MED ORDER — OXYCODONE HCL 5 MG PO TABS: ORAL_TABLET | ORAL | 0 refills | Status: AC

## 2020-05-05 MED ORDER — ONDANSETRON HCL 4 MG PO TABS: 4.0000 mg | ORAL_TABLET | Freq: Three times a day (TID) | ORAL | 0 refills | Status: AC | PRN

## 2020-05-05 MED ORDER — ONDANSETRON HCL 4 MG/2ML IJ SOLN
INTRAMUSCULAR | Status: AC
  Filled 2020-05-05: qty 2

## 2020-05-05 MED ORDER — ROCURONIUM BROMIDE 10 MG/ML (PF) SYRINGE
PREFILLED_SYRINGE | INTRAVENOUS | Status: AC
  Filled 2020-05-05: qty 10

## 2020-05-05 MED ORDER — VANCOMYCIN HCL 1000 MG IV SOLR
INTRAVENOUS | Status: DC | PRN
  Administered 2020-05-05: 1000 mg

## 2020-05-05 MED ORDER — OXYCODONE HCL 5 MG/5ML PO SOLN: 5.0000 mg | Freq: Once | ORAL | Status: DC | PRN

## 2020-05-05 MED ORDER — ACETAMINOPHEN 500 MG PO TABS: 1000.0000 mg | ORAL_TABLET | Freq: Once | ORAL | Status: DC

## 2020-05-05 MED ORDER — OXYCODONE HCL 5 MG PO TABS: 5.0000 mg | ORAL_TABLET | Freq: Once | ORAL | Status: DC | PRN

## 2020-05-05 MED ORDER — FENTANYL CITRATE (PF) 100 MCG/2ML IJ SOLN
100.0000 ug | Freq: Once | INTRAMUSCULAR | Status: AC
Start: 2020-05-05 — End: 2020-05-05
  Administered 2020-05-05: 50 ug via INTRAVENOUS

## 2020-05-05 MED ORDER — EPHEDRINE SULFATE 50 MG/ML IJ SOLN
INTRAMUSCULAR | Status: DC | PRN
  Administered 2020-05-05: 10 mg via INTRAVENOUS

## 2020-05-05 MED ORDER — ONDANSETRON HCL 4 MG/2ML IJ SOLN
INTRAMUSCULAR | Status: DC | PRN
  Administered 2020-05-05: 4 mg via INTRAVENOUS

## 2020-05-05 MED ORDER — CEFAZOLIN SODIUM-DEXTROSE 2-4 GM/100ML-% IV SOLN
2.0000 g | INTRAVENOUS | Status: AC
  Administered 2020-05-05: 2 g via INTRAVENOUS

## 2020-05-05 MED ORDER — CELECOXIB 100 MG PO CAPS: 100.0000 mg | ORAL_CAPSULE | Freq: Two times a day (BID) | ORAL | 0 refills | Status: AC

## 2020-05-05 MED ORDER — MIDAZOLAM HCL 2 MG/2ML IJ SOLN
2.0000 mg | Freq: Once | INTRAMUSCULAR | Status: AC
  Administered 2020-05-05: 1 mg via INTRAVENOUS

## 2020-05-05 MED ORDER — PROMETHAZINE HCL 25 MG/ML IJ SOLN
INTRAMUSCULAR | Status: AC
  Filled 2020-05-05: qty 1

## 2020-05-05 MED ORDER — AMISULPRIDE (ANTIEMETIC) 5 MG/2ML IV SOLN
10.0000 mg | Freq: Once | INTRAVENOUS | Status: AC
  Administered 2020-05-05: 10 mg via INTRAVENOUS

## 2020-05-05 MED ORDER — BUPIVACAINE-EPINEPHRINE (PF) 0.5% -1:200000 IJ SOLN
INTRAMUSCULAR | Status: DC | PRN
  Administered 2020-05-05: 15 mL via PERINEURAL

## 2020-05-05 MED ORDER — ACETAMINOPHEN 500 MG PO TABS
ORAL_TABLET | ORAL | Status: AC
  Filled 2020-05-05: qty 2

## 2020-05-05 MED ORDER — ASPIRIN 81 MG PO CHEW: 81.0000 mg | CHEWABLE_TABLET | Freq: Two times a day (BID) | ORAL | 0 refills | Status: AC

## 2020-05-05 MED ORDER — LACTATED RINGERS IV BOLUS: 500.0000 mL | Freq: Once | INTRAVENOUS | Status: DC

## 2020-05-05 MED ORDER — LIDOCAINE 2% (20 MG/ML) 5 ML SYRINGE
INTRAMUSCULAR | Status: AC
  Filled 2020-05-05: qty 5

## 2020-05-05 MED ORDER — SUGAMMADEX SODIUM 200 MG/2ML IV SOLN
INTRAVENOUS | Status: DC | PRN
  Administered 2020-05-05: 200 mg via INTRAVENOUS

## 2020-05-05 MED ORDER — MIDAZOLAM HCL 2 MG/2ML IJ SOLN
INTRAMUSCULAR | Status: AC
  Filled 2020-05-05: qty 2

## 2020-05-05 MED ORDER — VANCOMYCIN HCL IN DEXTROSE 1-5 GM/200ML-% IV SOLN
INTRAVENOUS | Status: AC
  Filled 2020-05-05: qty 200

## 2020-05-05 MED ORDER — FENTANYL CITRATE (PF) 100 MCG/2ML IJ SOLN
INTRAMUSCULAR | Status: AC
  Filled 2020-05-05: qty 2

## 2020-05-05 SURGICAL SUPPLY — 73 items
AID PSTN UNV HD RSTRNT DISP (MISCELLANEOUS) ×1
APL PRP STRL LF DISP 70% ISPRP (MISCELLANEOUS) ×1
BASEPLATE GLENOSPHERE 25 STD (Miscellaneous) ×1 IMPLANT
BIT DRILL 3.2 PERIPHERAL SCREW (BIT) ×1 IMPLANT
BLADE HEX COATED 2.75 (ELECTRODE) IMPLANT
BLADE SAW SAG 73X25 THK (BLADE) ×1
BLADE SAW SGTL 73X25 THK (BLADE) ×1 IMPLANT
BLADE SURG 10 STRL SS (BLADE) IMPLANT
BLADE SURG 15 STRL LF DISP TIS (BLADE) IMPLANT
BLADE SURG 15 STRL SS (BLADE)
BNDG COHESIVE 4X5 TAN STRL (GAUZE/BANDAGES/DRESSINGS) IMPLANT
BSPLAT GLND STD 25 RVRS SHLDR (Miscellaneous) ×1 IMPLANT
CHLORAPREP W/TINT 26 (MISCELLANEOUS) ×2 IMPLANT
CLSR STERI-STRIP ANTIMIC 1/2X4 (GAUZE/BANDAGES/DRESSINGS) ×2 IMPLANT
COOLER ICEMAN CLASSIC (MISCELLANEOUS) ×2 IMPLANT
COVER BACK TABLE 60X90IN (DRAPES) ×2 IMPLANT
COVER MAYO STAND STRL (DRAPES) ×2 IMPLANT
COVER WAND RF STERILE (DRAPES) IMPLANT
DECANTER SPIKE VIAL GLASS SM (MISCELLANEOUS) IMPLANT
DRAPE IMP U-DRAPE 54X76 (DRAPES) ×1 IMPLANT
DRAPE INCISE IOBAN 66X45 STRL (DRAPES) ×2 IMPLANT
DRAPE U-SHAPE 76X120 STRL (DRAPES) ×4 IMPLANT
DRSG AQUACEL AG ADV 3.5X 6 (GAUZE/BANDAGES/DRESSINGS) ×2 IMPLANT
ELECT BLADE 4.0 EZ CLEAN MEGAD (MISCELLANEOUS) ×2
ELECT REM PT RETURN 9FT ADLT (ELECTROSURGICAL) ×2
ELECTRODE BLDE 4.0 EZ CLN MEGD (MISCELLANEOUS) ×1 IMPLANT
ELECTRODE REM PT RTRN 9FT ADLT (ELECTROSURGICAL) ×1 IMPLANT
GLENOSPHERE REV SHOULDER 36 (Joint) ×1 IMPLANT
GLOVE SRG 8 PF TXTR STRL LF DI (GLOVE) ×1 IMPLANT
GLOVE SURG ENC MOIS LTX SZ6.5 (GLOVE) ×6 IMPLANT
GLOVE SURG LTX SZ8 (GLOVE) ×3 IMPLANT
GLOVE SURG UNDER POLY LF SZ6.5 (GLOVE) ×2 IMPLANT
GLOVE SURG UNDER POLY LF SZ7 (GLOVE) ×3 IMPLANT
GLOVE SURG UNDER POLY LF SZ8 (GLOVE) ×2
GOWN STRL REUS W/ TWL LRG LVL3 (GOWN DISPOSABLE) ×2 IMPLANT
GOWN STRL REUS W/ TWL XL LVL3 (GOWN DISPOSABLE) IMPLANT
GOWN STRL REUS W/TWL LRG LVL3 (GOWN DISPOSABLE) ×4
GOWN STRL REUS W/TWL XL LVL3 (GOWN DISPOSABLE) ×4 IMPLANT
GUIDEWIRE GLENOID 2.5X220 (WIRE) ×1 IMPLANT
HANDPIECE INTERPULSE COAX TIP (DISPOSABLE) ×2
IMPL REVERSE SHOULDER 0X3.5 (Shoulder) IMPLANT
IMPLANT REVERSE SHOULDER 0X3.5 (Shoulder) ×2 IMPLANT
INSERT HUMERAL 36X6MM 12.5DEG (Insert) ×1 IMPLANT
KIT LEG STABILIZATION (KITS) ×1 IMPLANT
KIT STABILIZATION SHOULDER (MISCELLANEOUS) ×1 IMPLANT
MANIFOLD NEPTUNE II (INSTRUMENTS) ×2 IMPLANT
PACK BASIN DAY SURGERY FS (CUSTOM PROCEDURE TRAY) ×2 IMPLANT
PACK SHOULDER (CUSTOM PROCEDURE TRAY) ×2 IMPLANT
PAD COLD SHLDR UNI WRAP-ON (PAD) ×2
PAD COLD SHLDR WRAP-ON (PAD) ×1 IMPLANT
PAD COLD UNI WRAP-ON (PAD) IMPLANT
PAD ORTHO SHOULDER 7X19 LRG (SOFTGOODS) ×2 IMPLANT
PENCIL SMOKE EVACUATOR (MISCELLANEOUS) ×1 IMPLANT
RESTRAINT HEAD UNIVERSAL NS (MISCELLANEOUS) ×2 IMPLANT
SCREW 5.0X38 SMALL F/PERFORM (Screw) ×1 IMPLANT
SCREW 5.5X22 (Screw) ×1 IMPLANT
SCREW CENTRAL THREAD 6.5X45 (Screw) ×1 IMPLANT
SET HNDPC FAN SPRY TIP SCT (DISPOSABLE) ×1 IMPLANT
SHEET MEDIUM DRAPE 40X70 STRL (DRAPES) ×2 IMPLANT
SLEEVE SCD COMPRESS KNEE MED (STOCKING) ×2 IMPLANT
SPONGE LAP 18X18 RF (DISPOSABLE) IMPLANT
STEM HUMERAL SZ2B STND 70 PTC (Stem) ×2 IMPLANT
STEM HUMERAL SZ2BSTD 70 PTC (Stem) IMPLANT
SUT ETHIBOND 2 V 37 (SUTURE) ×2 IMPLANT
SUT ETHIBOND NAB CT1 #1 30IN (SUTURE) ×2 IMPLANT
SUT FIBERWIRE #5 38 CONV NDL (SUTURE) ×10
SUT MNCRL AB 4-0 PS2 18 (SUTURE) ×1 IMPLANT
SUT VIC AB 3-0 SH 27 (SUTURE) ×2
SUT VIC AB 3-0 SH 27X BRD (SUTURE) ×1 IMPLANT
SUTURE FIBERWR #5 38 CONV NDL (SUTURE) ×3 IMPLANT
SYR BULB IRRIG 60ML STRL (SYRINGE) IMPLANT
TOWEL GREEN STERILE FF (TOWEL DISPOSABLE) ×6 IMPLANT
TUBE SUCTION HIGH CAP CLEAR NV (SUCTIONS) ×2 IMPLANT

## 2020-05-05 NOTE — Anesthesia Postprocedure Evaluation (Signed)
Anesthesia Post Note  Patient: Amanda Merritt  Procedure(s) Performed: REVERSE SHOULDER ARTHROPLASTY (Right Shoulder)     Patient location during evaluation: PACU Anesthesia Type: General Level of consciousness: awake and alert and oriented Pain management: pain level controlled Vital Signs Assessment: post-procedure vital signs reviewed and stable Respiratory status: spontaneous breathing, nonlabored ventilation and respiratory function stable Cardiovascular status: blood pressure returned to baseline Postop Assessment: no apparent nausea or vomiting Anesthetic complications: no   No complications documented.  Last Vitals:  Vitals:   05/05/20 1500 05/05/20 1515  BP: (!) 149/65 (!) 143/59  Pulse: 67 67  Resp: 15 15  Temp:    SpO2: 98% 97%    Last Pain:  Vitals:   05/05/20 1500  TempSrc:   PainSc: 0-No pain                 Brennan Bailey

## 2020-05-05 NOTE — H&P (Signed)
PREOPERATIVE H&P  Chief Complaint: djd right shoulder  HPI: Amanda Merritt is a 75 y.o. female who is scheduled for Procedure(s): REVERSE SHOULDER ARTHROPLASTY.   Patient has a past medical history significant for HTN, GERD, IBS.   Patient is a 75 year-old female who has had acute on chronic pain in her right shoulder.  She has had injections in the past and physical therapy in the past for a presumed cuff tear.  She unfortunately had a fall and afterwards had a pop in her right shoulder.  She has had dysfunction in the shoulder since then.    Her symptoms are rated as moderate to severe, and have been worsening.  This is significantly impairing activities of daily living.    Please see clinic note for further details on this patient's care.    She has elected for surgical management.   Past Medical History:  Diagnosis Date  . Adenomatous polyps   . Allergy   . Anemia    with pregnancy   . Arthritis   . GERD (gastroesophageal reflux disease)   . History of COVID-19    received infusion therapy  . Hyperlipidemia   . Hypertension   . IBS (irritable bowel syndrome)    alternates with constipation and loose stools- takes fiber daily   . Prediabetes    controlled with diet and exercise   Past Surgical History:  Procedure Laterality Date  . ABDOMINAL HYSTERECTOMY     left ovaries   . bladder tac    . CATARACT EXTRACTION, BILATERAL    . CHOLECYSTECTOMY    . COLONOSCOPY    . POLYPECTOMY     Social History   Socioeconomic History  . Marital status: Married    Spouse name: Not on file  . Number of children: Not on file  . Years of education: Not on file  . Highest education level: Not on file  Occupational History  . Not on file  Tobacco Use  . Smoking status: Former Research scientist (life sciences)  . Smokeless tobacco: Never Used  . Tobacco comment: quit early 20's  Substance and Sexual Activity  . Alcohol use: Not Currently  . Drug use: Never  . Sexual activity: Not on file   Other Topics Concern  . Not on file  Social History Narrative  . Not on file   Social Determinants of Health   Financial Resource Strain: Not on file  Food Insecurity: Not on file  Transportation Needs: Not on file  Physical Activity: Not on file  Stress: Not on file  Social Connections: Not on file   Family History  Problem Relation Age of Onset  . Stomach cancer Paternal Aunt   . Breast cancer Paternal Aunt   . Breast cancer Paternal Aunt   . Colon cancer Neg Hx   . Colon polyps Neg Hx   . Esophageal cancer Neg Hx   . Rectal cancer Neg Hx    Allergies  Allergen Reactions  . Codeine Itching and Other (See Comments)    unknown  . Other Other (See Comments)    Lumps over body  . Atorvastatin Other (See Comments)    Unknown, cramping and lumps in arms and legs  . Diazepam Other (See Comments)    Too tired the next day  . Ezetimibe Other (See Comments)    unknown   Prior to Admission medications   Medication Sig Start Date End Date Taking? Authorizing Provider  aspirin 81 MG EC tablet Take by  mouth.   Yes [provider]  calcium carbonate (TUMS - DOSED IN MG ELEMENTAL CALCIUM) 500 MG chewable tablet Chew 1 tablet by mouth as needed for indigestion or heartburn.   Yes [provider]  Cholecalciferol 25 MCG (1000 UT) tablet Take by mouth.   Yes [provider]  docusate sodium (STOOL SOFTENER) 100 MG capsule Take 100 mg by mouth 2 (two) times daily. [employer] brand of generic   Yes [provider]  FIBER COMPLETE PO Take by mouth daily.   Yes [provider]  magnesium gluconate (MAGONATE) 500 MG tablet Take by mouth.   Yes [provider]  Multiple Vitamins-Minerals (ALIVE WOMENS ENERGY) TABS Take by mouth. 10/01/15  Yes [provider]  Omega-3 Fatty Acids (FISH OIL) 1000 MG CAPS Take 2,000 mg by mouth in the morning and at bedtime.   Yes [provider]  valsartan-hydrochlorothiazide (DIOVAN-HCT)  160-25 MG tablet Take 1 tablet by mouth daily.   Yes [provider]  vitamin E 600 UNIT capsule Take by mouth.   Yes [provider]    ROS: All other systems have been reviewed and were otherwise negative with the exception of those mentioned in the HPI and as above.  Physical Exam: General: Alert, no acute distress Cardiovascular: No pedal edema Respiratory: No cyanosis, no use of accessory musculature GI: No organomegaly, abdomen is soft and non-tender Skin: No lesions in the area of chief complaint Neurologic: Sensation intact distally Psychiatric: Patient is competent for consent with normal mood and affect Lymphatic: No axillary or cervical lymphadenopathy  MUSCULOSKELETAL:  Right shoulder: Active forward elevation to 90, passive to 120.  4/5 cuff strength throughout.  External rotation to 0 and has an external rotation lag of sorts, though she does have some teres function.    Imaging: Right shoulder MRI reviewed and demonstrates a massive irreparable cuff tear of the superior cuff with no other abnormalities.    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   We additionally specifically discussed risks of axillary nerve injury, infection, periprosthetic fracture, continued pain and longevity of implants prior to beginning procedure.    Patient will be closely monitored in PACU for medical stabilization and pain control. If found stable in PACU, patient may be discharged home with outpatient follow-up. If any concerns regarding patient's stabilization patient will  be admitted for observation after surgery. The patient is planning to be discharged home with outpatient PT.   The patient acknowledged the explanation, agreed to proceed with the plan and consent was signed.   She has received operative clearance from her PCP, Dr. Janie Morning  Operative Plan: Right reverse total shoulder arthroplasty Discharge Medications: Tylenol, Celebrex, Oxycodone, Zofran, Omeprazole DVT Prophylaxis: Aspirin Physical Therapy: Outpatient PT Special Discharge needs: Sling. IceMan.    Ethelda Chick, PA-C  05/05/2020 6:58 AM

## 2020-05-05 NOTE — Progress Notes (Signed)
Assisted Dr. Howze with right, ultrasound guided, interscalene  block. Side rails up, monitors on throughout procedure. See vital signs in flow sheet. Tolerated Procedure well. °

## 2020-05-05 NOTE — Anesthesia Preprocedure Evaluation (Addendum)
Anesthesia Evaluation  Patient identified by MRN, date of birth, ID band Patient awake    Reviewed: Allergy & Precautions, NPO status , Patient's Chart, lab work & pertinent test results  History of Anesthesia Complications Negative for: history of anesthetic complications  Airway Mallampati: II  TM Distance: >3 FB Neck ROM: Full    Dental no notable dental hx.    Pulmonary former smoker,    Pulmonary exam normal        Cardiovascular hypertension, Pt. on medications Normal cardiovascular exam     Neuro/Psych negative neurological ROS  negative psych ROS   GI/Hepatic Neg liver ROS, GERD  Controlled,  Endo/Other  negative endocrine ROS  Renal/GU negative Renal ROS  negative genitourinary   Musculoskeletal  (+) Arthritis ,   Abdominal   Peds  Hematology negative hematology ROS (+)   Anesthesia Other Findings Day of surgery medications reviewed with patient.  Reproductive/Obstetrics negative OB ROS                            Anesthesia Physical Anesthesia Plan  ASA: II  Anesthesia Plan: General   Post-op Pain Management: GA combined w/ Regional for post-op pain   Induction: Intravenous  PONV Risk Score and Plan: 3 and Treatment may vary due to age or medical condition, Ondansetron, Dexamethasone and Midazolam  Airway Management Planned: Oral ETT  Additional Equipment: None  Intra-op Plan:   Post-operative Plan: Extubation in OR  Informed Consent: I have reviewed the patients History and Physical, chart, labs and discussed the procedure including the risks, benefits and alternatives for the proposed anesthesia with the patient or authorized representative who has indicated his/her understanding and acceptance.     Dental advisory given  Plan Discussed with: CRNA  Anesthesia Plan Comments:        Anesthesia Quick Evaluation

## 2020-05-05 NOTE — Anesthesia Procedure Notes (Signed)
Anesthesia Regional Block: Interscalene brachial plexus block   Pre-Anesthetic Checklist: ,, timeout performed, Correct Patient, Correct Site, Correct Laterality, Correct Procedure, Correct Position, site marked, Risks and benefits discussed, pre-op evaluation,  At surgeon's request and post-op pain management  Laterality: Right  Prep: Maximum Sterile Barrier Precautions used, chloraprep       Needles:  Injection technique: Single-shot  Needle Type: Echogenic Stimulator Needle     Needle Length: 4cm  Needle Gauge: 22     Additional Needles:   Procedures:,,,, ultrasound used (permanent image in chart),,,,  Narrative:  Start time: 05/05/2020 12:15 PM End time: 05/05/2020 12:18 PM Injection made incrementally with aspirations every 5 mL.  Performed by: Personally  Anesthesiologist: Brennan Bailey, MD  Additional Notes: Risks, benefits, and alternative discussed. Patient gave consent for procedure. Patient prepped and draped in sterile fashion. Sedation administered, patient remains easily responsive to voice. Relevant anatomy identified with ultrasound guidance. Local anesthetic given in 5cc increments with no signs or symptoms of intravascular injection. No pain or paraesthesias with injection. Patient monitored throughout procedure with signs of LAST or immediate complications. Tolerated well. Ultrasound image placed in chart.  Tawny Asal, MD

## 2020-05-05 NOTE — Anesthesia Procedure Notes (Signed)
Procedure Name: Intubation Date/Time: 05/05/2020 12:59 PM Performed by: Maryella Shivers, CRNA Pre-anesthesia Checklist: Patient identified, Emergency Drugs available, Suction available and Patient being monitored Patient Re-evaluated:Patient Re-evaluated prior to induction Oxygen Delivery Method: Circle system utilized Preoxygenation: Pre-oxygenation with 100% oxygen Induction Type: IV induction Ventilation: Mask ventilation without difficulty Laryngoscope Size: Mac and 3 Grade View: Grade I Tube type: Oral Tube size: 7.0 mm Number of attempts: 1 Airway Equipment and Method: Stylet and Oral airway Placement Confirmation: ETT inserted through vocal cords under direct vision,  positive ETCO2 and breath sounds checked- equal and bilateral Secured at: 20 cm Tube secured with: Tape Dental Injury: Teeth and Oropharynx as per pre-operative assessment

## 2020-05-05 NOTE — Discharge Instructions (Signed)
Ophelia Charter MD, MPH Noemi Chapel, PA-C [facility] [address] [phone] (tel)   [phone] (fax)   [place] REPLACEMENT    WOUND CARE ? You may leave the operative dressing in place until your follow-up appointment. ? KEEP THE INCISIONS CLEAN AND DRY. ? There may be a small amount of fluid/bleeding leaking at the surgical site. This is normal after surgery.  ? If it fills with liquid or blood please call us immediately to change it for you. ? Use the provided ice machine or Ice packs as often as possible for the first 3-4 days, then as needed for pain relief.  Keep a layer of cloth or a shirt between your skin and the cooling unit to prevent frost bite as it can get very cold.  SHOWERING: - You may shower on Post-Op Day #2.  - The dressing is water resistant but do not scrub it as it may start to peel up.   - You may remove the sling for showering, but keep a water resistant pillow under the arm to keep both the  elbow and shoulder away from the body (mimicking the abduction sling).  - Gently pat the area dry.  - Do not soak the shoulder in water. Do not go swimming in the pool or ocean until your incision has completely healed (about 4-6 weeks after surgery) - KEEP THE INCISIONS CLEAN AND DRY.  EXERCISES ? Wear the sling at all times ? You may remove the sling for showering, but keep the arm across the chest or in a secondary sling.    ? Accidental/Purposeful External Rotation and shoulder flexion (reaching behind you) is to be avoided at all costs for the first month. ? It is ok to come out of your sling if your are sitting and have assistance for eating.   ? Do not lift anything heavier than 1 pound until we discuss it further in clinic.  REGIONAL ANESTHESIA (NERVE BLOCKS) . The anesthesia team may have performed a nerve block for you if safe in the setting of your care.  This is a great tool used to  minimize pain.  Typically the block may start wearing off overnight but the long acting medicine may last for 3-4 days.  The nerve block wearing off can be a challenging period but please utilize your as needed pain medications to try and manage this period.    POST-OP MEDICATIONS- Multimodal approach to pain control . In general your pain will be controlled with a combination of substances.  Prescriptions unless otherwise discussed are electronically sent to your pharmacy.  This is a carefully made plan we use to minimize narcotic use.     ? Celebrex - Anti-inflammatory medication taken on a scheduled basis ? Take 1 tablet twice a day ? Acetaminophen - Non-narcotic pain medicine taken on a scheduled basis  ? Take two 500 mg tablets (1,000 mg total) every 8 hours ? Oxycodone - This is a strong narcotic, to be used only on an "as needed" basis for severe pain. ? Aspirin 81mg  - This medicine is used to minimize the risk of blood clots after surgery. ? Take 1 tablet twice a day for 6 weeks ? Omeprazole - daily medicine to protect your stomach while taking anti-inflammatories.  ? Zofran -  take as needed for nausea   FOLLOW-UP ? If you develop a Fever (>101.5), Redness or Drainage from the surgical incision site, please call  our office to arrange for an evaluation. ? Please call the office to schedule a follow-up appointment for a wound check, 7-10 days post-operatively.  IF YOU HAVE ANY QUESTIONS, PLEASE FEEL FREE TO CALL OUR OFFICE.  HELPFUL INFORMATION  . If you had a block, it will wear off between 8-24 hrs postop typically.  This is period when your pain may go from nearly zero to the pain you would have had post-op without the block.  This is an abrupt transition but nothing dangerous is happening.  You may take an extra dose of narcotic when this happens.  ? Your arm will be in a sling following surgery. You will be in this sling for the next 4 weeks.  I will let you know the exact  duration at your follow-up visit.  ? You may be more comfortable sleeping in a semi-seated position the first few nights following surgery.  Keep a pillow propped under the elbow and forearm for comfort.  If you have a recliner type of chair it might be beneficial.  If not that is fine too, but it would be helpful to sleep propped up with pillows behind your operated shoulder as well under your elbow and forearm.  This will reduce pulling on the suture lines.  ? When dressing, put your operative arm in the sleeve first.  When getting undressed, take your operative arm out last.  Loose fitting, button-down shirts are recommended.  ? In most states it is against the law to drive while your arm is in a sling. And certainly against the law to drive while taking narcotics.  ? You may return to work/school in the next couple of days when you feel up to it. Desk work and typing in the sling is fine.  ? We suggest you use the pain medication the first night prior to going to bed, in order to ease any pain when the anesthesia wears off. You should avoid taking pain medications on an empty stomach as it will make you nauseous.  ? Do not drink alcoholic beverages or take illicit drugs when taking pain medications.  ? Pain medication may make you constipated.  Below are a few solutions to try in this order: - Decrease the amount of pain medication if you aren't having pain. - Drink lots of decaffeinated fluids. - Drink prune juice and/or each dried prunes  o If the first 3 don't work start with additional solutions - Take Colace - an over-the-counter stool softener - Take Senokot - an over-the-counter laxative - Take Miralax - a stronger over-the-counter laxative   Dental Antibiotics:  In most cases prophylactic antibiotics for Dental procdeures after total joint surgery are not necessary.  Exceptions are as follows:  1. History of prior total joint infection  2. Severely immunocompromised  (Organ Transplant, cancer chemotherapy, Rheumatoid biologic meds such as [place])  3. Poorly controlled diabetes (A1C &gt; 8.0, blood glucose over 200)  If you have one of these conditions, contact your surgeon for an antibiotic prescription, prior to your dental procedure.   For more information including helpful videos and documents visit our website:   [email or web address]    May take Tylenol after 5:30pm, if needed.   Post Anesthesia Home Care Instructions  Activity: Get plenty of rest for the remainder of the day. A responsible individual must stay with you for 24 hours following the procedure.  For the next 24 hours, DO NOT: -Drive a car -Paediatric nurse -Drink alcoholic beverages -Take any  medication unless instructed by your physician -Make any legal decisions or sign important papers.  Meals: Start with liquid foods such as gelatin or soup. Progress to regular foods as tolerated. Avoid greasy, spicy, heavy foods. If nausea and/or vomiting occur, drink only clear liquids until the nausea and/or vomiting subsides. Call your physician if vomiting continues.  Special Instructions/Symptoms: Your throat may feel dry or sore from the anesthesia or the breathing tube placed in your throat during surgery. If this causes discomfort, gargle with warm salt water. The discomfort should disappear within 24 hours.  If you had a scopolamine patch placed behind your ear for the management of post- operative nausea and/or vomiting:  1. The medication in the patch is effective for 72 hours, after which it should be removed.  Wrap patch in a tissue and discard in the trash. Wash hands thoroughly with soap and water. 2. You may remove the patch earlier than 72 hours if you experience unpleasant side effects which may include dry mouth, dizziness or visual disturbances. 3. Avoid touching the patch. Wash your hands with soap and water after contact with the  patch.    Regional Anesthesia Blocks  1. Numbness or the inability to move the "blocked" extremity may last from 3-48 hours after placement. The length of time depends on the medication injected and your individual response to the medication. If the numbness is not going away after 48 hours, call your surgeon.  2. The extremity that is blocked will need to be protected until the numbness is gone and the  Strength has returned. Because you cannot feel it, you will need to take extra care to avoid injury. Because it may be weak, you may have difficulty moving it or using it. You may not know what position it is in without looking at it while the block is in effect.  3. For blocks in the legs and feet, returning to weight bearing and walking needs to be done carefully. You will need to wait until the numbness is entirely gone and the strength has returned. You should be able to move your leg and foot normally before you try and bear weight or walk. You will need someone to be with you when you first try to ensure you do not fall and possibly risk injury.  4. Bruising and tenderness at the needle site are common side effects and will resolve in a few days.  5. Persistent numbness or new problems with movement should be communicated to the surgeon or the [place] [phone] [place] [phone]).Information for Discharge Teaching: EXPAREL (bupivacaine liposome injectable suspension)   Your surgeon or anesthesiologist gave you EXPAREL(bupivacaine) to help control your pain after surgery.   EXPAREL is a local anesthetic that provides pain relief by numbing the tissue around the surgical site.  EXPAREL is designed to release pain medication over time and can control pain for up to 72 hours.  Depending on how you respond to EXPAREL, you may require less pain medication during your recovery.  Possible side effects:  Temporary loss of sensation or ability to move  in the area where bupivacaine was injected.  Nausea, vomiting, constipation  Rarely, numbness and tingling in your mouth or lips, lightheadedness, or anxiety may occur.  Call your doctor right away if you think you may be experiencing any of these sensations, or if you have other questions regarding possible side effects.  Follow all other discharge instructions given to you by your  surgeon or nurse. Eat a healthy diet and drink plenty of water or other fluids.  If you return to the hospital for any reason within 96 hours following the administration of EXPAREL, it is important for health care providers to know that you have received this anesthetic. A teal colored band has been placed on your arm with the date, time and amount of EXPAREL you have received in order to alert and inform your health care providers. Please leave this armband in place for the full 96 hours following administration, and then you may remove the band.

## 2020-05-05 NOTE — Transfer of Care (Signed)
Immediate Anesthesia Transfer of Care Note  Patient: Amanda Merritt  Procedure(s) Performed: REVERSE SHOULDER ARTHROPLASTY (Right Shoulder)  Patient Location: PACU  Anesthesia Type:GA combined with regional for post-op pain  Level of Consciousness: sedated  Airway & Oxygen Therapy: Patient Spontanous Breathing and Patient connected to face mask oxygen  Post-op Assessment: Report given to RN and Post -op Vital signs reviewed and stable  Post vital signs: Reviewed and stable  Last Vitals:  Vitals Value Taken Time  BP 138/58 05/05/20 1415  Temp    Pulse 79 05/05/20 1416  Resp 19 05/05/20 1416  SpO2 100 % 05/05/20 1416  Vitals shown include unvalidated device data.  Last Pain:  Vitals:   05/05/20 1124  TempSrc: Oral  PainSc: 3       Patients Stated Pain Goal: 4 (62/56/38 9373)  Complications: No complications documented.

## 2020-05-05 NOTE — Interval H&P Note (Signed)
History and Physical Interval Note:  05/05/2020 12:06 PM  Amanda Merritt  has presented today for surgery, with the diagnosis of djd right shoulder.  The various methods of treatment have been discussed with the patient and family. After consideration of risks, benefits and other options for treatment, the patient has consented to  Procedure(s): REVERSE SHOULDER ARTHROPLASTY (Right) as a surgical intervention.  The patient's history has been reviewed, patient examined, no change in status, stable for surgery.  I have reviewed the patient's chart and labs.  Questions were answered to the patient's satisfaction.     Hiram Gash

## 2020-05-06 ENCOUNTER — Encounter (HOSPITAL_BASED_OUTPATIENT_CLINIC_OR_DEPARTMENT_OTHER): Payer: Self-pay | Admitting: Orthopaedic Surgery

## 2020-05-06 NOTE — Op Note (Signed)
Orthopaedic Surgery Operative Note (CSN: [id number])  [place]  [birth year] Date of Surgery: 05/05/2020   Diagnoses:  Right shoulder irreparable cuff tear  Procedure: Right reverse total Shoulder Arthroplasty   Operative Finding Successful completion of planned procedure.  Good bone quality overall.  The subscapularis had some reasonable tissue that was able to be repaired at the end of the case however there was no superior cuff that was able to be repaired and this was clearly a case for reverse total shoulder arthroplasty.  Axillary nerve tug test was intact in the beginning of the case.  Post-operative plan: The patient will be NWB in sling.  The patient will be will be discharged from PACU if continues to be stable as was plan prior to surgery.  DVT prophylaxis Aspirin 81 mg twice daily for 6 weeks.  Pain control with PRN pain medication preferring oral medicines.  Follow up plan will be scheduled in approximately 7 days for incision check and XR.  Physical therapy to start after first visit.  Implants: Tornier size 2B short stem, 0 high offset tray with a 36+6 poly-, standard 25 mm baseplate with a 45 center screw and a 36 mm glenosphere.  Post-Op Diagnosis: Same Surgeons:Primary: Hiram Gash, MD Assistants:Caroline McBane PA-C Location: [facility] OR ROOM 6 Anesthesia: General with Exparel Interscalene Antibiotics: Ancef 2g preop, Vancomycin 1085m locally Tourniquet time: None Estimated Blood Loss: 1468Complications: None Specimens: None Implants: Implant Name Type Inv. Item Serial No. Manufacturer Lot No. LRB No. Used Action  BASEPLATE GLENOSPHERE 203OZSTD - [id number] BASEPLATE GLENOSPHERE 204UGSTD [id number]  Right 1 Implanted  GLENOSPHERE REV SHOULDER 36 - [id number] GLENOSPHERE REV SHOULDER 36 [id number]  Right 1 Implanted  SCREW CENTRAL THREAD 6.5X45 - [id number] SCREW CENTRAL THREAD 6.5X45  [employer] STERILIZED ON  SET Right 1 Implanted  SCREW 5.0X38 SMALL F/PERFORM - [id number] SCREW 5.0X38 SMALL F/PERFORM  [employer] STERILIZED ON SET Right 1 Implanted  SCREW 5.5X22 - [id number] SCREW 5.5X22  [employer] STERILIZED ON SET Right 1 Implanted  INSERT HUMERAL 36X6MM 12.5DEG - [id number] INSERT HUMERAL 36X6MM 12.5DEG [id number]  Right 1 Implanted  STEM HUMERAL SZ2B STND 70 PTC - [id number] STEM HUMERAL SZ2B STND 70 PTC [id number]  Right 1 Implanted  IMPLANT REVERSE SHOULDER 0X3.5 - [id number] IMPLANT REVERSE SHOULDER 0X3.5 [id number]  Right 1 Implanted    Indications for Surgery:   WPrapti GrussingChrisco is a 75y.o. female with irreparable rotator cuff tear and dysfunction.  Benefits and risks of operative and nonoperative management were discussed prior to surgery with patient/guardian(s) and informed consent form was completed.  Infection and need for further surgery were discussed as was prosthetic stability and cuff issues.  We additionally specifically discussed risks of axillary nerve injury, infection, periprosthetic fracture, continued pain and longevity of implants prior to beginning procedure.      Procedure:   The patient was identified in the preoperative holding area where the surgical site was marked. Block placed by anesthesia with exparel.  The patient was taken to the OR where a procedural timeout was called and the above noted anesthesia was induced.  The patient was positioned beachchair on allen table with spider arm positioner.  Preoperative antibiotics were dosed.  The patient's right shoulder was prepped and draped in the usual sterile fashion.  A second preoperative timeout was called.       Standard  deltopectoral approach was performed with a #10 blade. We dissected down to the subcutaneous tissues and the cephalic vein was taken laterally with the deltoid. Clavipectoral fascia was incised in line with the incision. Deep  retractors were placed. The long of the biceps tendon was identified and there was significant tenosynovitis present.  Tenodesis was performed to the pectoralis tendon with #2 Ethibond. The remaining biceps was followed up into the rotator interval where it was released.   The subscapularis was taken down in a full thickness layer with capsule along the humeral neck extending inferiorly around the humeral head. We continued releasing the capsule directly off of the osteophytes inferiorly all the way around the corner. This allowed [place] to dislocate the humeral head.    The rotator cuff was carefully examined and noted to be irreperably torn.  The decision was confirmed that a reverse total shoulder was indicated for this patient.  There were osteophytes along the inferior humeral neck. The osteophytes were removed with an osteotome and a rongeur.  Osteophytes were removed with a rongeur and an osteotome and the anatomic neck was well visualized.     A humeral cutting guide was inserted down the intramedullary canal. The version was set at 20 of retroversion. Humeral osteotomy was performed with an oscillating saw. The head fragment was passed off the back table. A starter awl was used to open the humeral canal. We next used T-handle straight sound reamers to ream up to an appropriate fit. A chisel was used to remove proximal humeral bone. We then broached starting with a size one broach and broaching up to 2 which obtained an appropriate fit. The broach handle was removed. A cut protector was placed. The broach handle was removed and a cut protector was placed. The humerus was retracted posteriorly and we turned our attention to glenoid exposure.  The subscapularis was again identified and immediately we took care to palpate the axillary nerve anteriorly and verify its position with gentle palpation as well as the tug test.  We then released the SGHL with bovie cautery prior to placing a curved mayo at the  junction of the anterior glenoid well above the axillary nerve and bluntly dissecting the subscapularis from the capsule.  We then carefully protected the axillary nerve as we gently released the inferior capsule to fully mobilize the subscapularis.  An anterior deltoid retractor was then placed as well as a small Hohmann retractor superiorly.     The glenoid was relatively intact in the setting of an irreparable cuff tear  The remaining labrum was removed circumferentially taking great care not to disrupt the posterior capsule.   The glenoid drill guide was placed and used to drill a guide pin in the center, inferior position. The glenoid face was then reamed concentrically over the guide wire. The center hole was drilled over the guidepin in a near anatomic angle of version. Next the glenoid vault was drilled back to a depth of 45 mm.  We tapped and then placed a 31m size baseplate with additional 056mlateralization was selected with a 6.5 mm x 45 mm length central screw.  The base plate was screwed into the glenoid vault obtaining secure fixation. We next placed superior and inferior locking screws for additional fixation.  Next a 36 mm glenosphere was selected and impacted onto the baseplate. The center screw was tightened.  We turned attention back to the humeral side. The cut protector was removed. We trialed with multiple size tray  and polyethylene options and selected a 6 which provided good stability and range of motion without excess soft tissue tension. The offset was dialed in to match the normal anatomy. The shoulder was trialed.  There was good ROM in all planes and the shoulder was stable with no inferior translation.  The real humeral implants were opened after again confirming sizes.  The trial was removed. #5 Fiberwire x4 sutures passed through the humeral neck for subscap repair. The humeral component was press-fit obtaining a secure fit. A +0 high offset tray was selected and  impacted onto the stem.  A 36+6 polyethylene liner was impacted onto the stem.  The joint was reduced and thoroughly irrigated with pulsatile lavage. Subscap was repaired back with #5 Fiberwire sutures through bone tunnels. Hemostasis was obtained. The deltopectoral interval was reapproximated with #1 Ethibond. The subcutaneous tissues were closed with 2-0 Vicryl and the skin was closed with running monocryl.    The wounds were cleaned and dried and an Aquacel dressing was placed. The drapes taken down. The arm was placed into sling with abduction pillow. Patient was awakened, extubated, and transferred to the recovery room in stable condition. There were no intraoperative complications. The sponge, needle, and attention counts were  correct at the end of the case.        Noemi Chapel, PA-C, present and scrubbed throughout the case, critical for completion in a timely fashion, and for retraction, instrumentation, closure.

## 2020-05-07 DIAGNOSIS — M6281 Muscle weakness (generalized): Secondary | ICD-10-CM | POA: Diagnosis not present

## 2020-05-07 DIAGNOSIS — M25611 Stiffness of right shoulder, not elsewhere classified: Secondary | ICD-10-CM | POA: Diagnosis not present

## 2020-05-07 DIAGNOSIS — M25511 Pain in right shoulder: Secondary | ICD-10-CM | POA: Diagnosis not present

## 2020-05-10 DIAGNOSIS — M25511 Pain in right shoulder: Secondary | ICD-10-CM | POA: Diagnosis not present

## 2020-05-10 DIAGNOSIS — M6281 Muscle weakness (generalized): Secondary | ICD-10-CM | POA: Diagnosis not present

## 2020-05-10 DIAGNOSIS — M25611 Stiffness of right shoulder, not elsewhere classified: Secondary | ICD-10-CM | POA: Diagnosis not present

## 2020-05-13 DIAGNOSIS — M19011 Primary osteoarthritis, right shoulder: Secondary | ICD-10-CM | POA: Diagnosis not present

## 2020-05-14 DIAGNOSIS — M25611 Stiffness of right shoulder, not elsewhere classified: Secondary | ICD-10-CM | POA: Diagnosis not present

## 2020-05-14 DIAGNOSIS — M25511 Pain in right shoulder: Secondary | ICD-10-CM | POA: Diagnosis not present

## 2020-05-14 DIAGNOSIS — M6281 Muscle weakness (generalized): Secondary | ICD-10-CM | POA: Diagnosis not present

## 2020-05-17 DIAGNOSIS — M25611 Stiffness of right shoulder, not elsewhere classified: Secondary | ICD-10-CM | POA: Diagnosis not present

## 2020-05-17 DIAGNOSIS — M6281 Muscle weakness (generalized): Secondary | ICD-10-CM | POA: Diagnosis not present

## 2020-05-17 DIAGNOSIS — M25511 Pain in right shoulder: Secondary | ICD-10-CM | POA: Diagnosis not present

## 2020-05-21 DIAGNOSIS — M25611 Stiffness of right shoulder, not elsewhere classified: Secondary | ICD-10-CM | POA: Diagnosis not present

## 2020-05-21 DIAGNOSIS — M6281 Muscle weakness (generalized): Secondary | ICD-10-CM | POA: Diagnosis not present

## 2020-05-21 DIAGNOSIS — M25511 Pain in right shoulder: Secondary | ICD-10-CM | POA: Diagnosis not present

## 2020-05-24 DIAGNOSIS — M25611 Stiffness of right shoulder, not elsewhere classified: Secondary | ICD-10-CM | POA: Diagnosis not present

## 2020-05-24 DIAGNOSIS — M25511 Pain in right shoulder: Secondary | ICD-10-CM | POA: Diagnosis not present

## 2020-05-24 DIAGNOSIS — M6281 Muscle weakness (generalized): Secondary | ICD-10-CM | POA: Diagnosis not present

## 2020-05-28 DIAGNOSIS — M25611 Stiffness of right shoulder, not elsewhere classified: Secondary | ICD-10-CM | POA: Diagnosis not present

## 2020-05-28 DIAGNOSIS — M6281 Muscle weakness (generalized): Secondary | ICD-10-CM | POA: Diagnosis not present

## 2020-05-28 DIAGNOSIS — M25511 Pain in right shoulder: Secondary | ICD-10-CM | POA: Diagnosis not present

## 2020-06-01 DIAGNOSIS — M6281 Muscle weakness (generalized): Secondary | ICD-10-CM | POA: Diagnosis not present

## 2020-06-01 DIAGNOSIS — M25611 Stiffness of right shoulder, not elsewhere classified: Secondary | ICD-10-CM | POA: Diagnosis not present

## 2020-06-01 DIAGNOSIS — M25511 Pain in right shoulder: Secondary | ICD-10-CM | POA: Diagnosis not present

## 2020-06-03 DIAGNOSIS — M25611 Stiffness of right shoulder, not elsewhere classified: Secondary | ICD-10-CM | POA: Diagnosis not present

## 2020-06-03 DIAGNOSIS — M6281 Muscle weakness (generalized): Secondary | ICD-10-CM | POA: Diagnosis not present

## 2020-06-03 DIAGNOSIS — M25511 Pain in right shoulder: Secondary | ICD-10-CM | POA: Diagnosis not present

## 2020-06-04 DIAGNOSIS — M19011 Primary osteoarthritis, right shoulder: Secondary | ICD-10-CM | POA: Diagnosis not present

## 2020-06-07 DIAGNOSIS — M6281 Muscle weakness (generalized): Secondary | ICD-10-CM | POA: Diagnosis not present

## 2020-06-07 DIAGNOSIS — M25611 Stiffness of right shoulder, not elsewhere classified: Secondary | ICD-10-CM | POA: Diagnosis not present

## 2020-06-07 DIAGNOSIS — M25511 Pain in right shoulder: Secondary | ICD-10-CM | POA: Diagnosis not present

## 2020-06-10 DIAGNOSIS — M6281 Muscle weakness (generalized): Secondary | ICD-10-CM | POA: Diagnosis not present

## 2020-06-10 DIAGNOSIS — M25511 Pain in right shoulder: Secondary | ICD-10-CM | POA: Diagnosis not present

## 2020-06-10 DIAGNOSIS — M25611 Stiffness of right shoulder, not elsewhere classified: Secondary | ICD-10-CM | POA: Diagnosis not present

## 2020-06-14 DIAGNOSIS — M25611 Stiffness of right shoulder, not elsewhere classified: Secondary | ICD-10-CM | POA: Diagnosis not present

## 2020-06-14 DIAGNOSIS — M25511 Pain in right shoulder: Secondary | ICD-10-CM | POA: Diagnosis not present

## 2020-06-14 DIAGNOSIS — M6281 Muscle weakness (generalized): Secondary | ICD-10-CM | POA: Diagnosis not present

## 2020-06-17 DIAGNOSIS — M25611 Stiffness of right shoulder, not elsewhere classified: Secondary | ICD-10-CM | POA: Diagnosis not present

## 2020-06-17 DIAGNOSIS — M25511 Pain in right shoulder: Secondary | ICD-10-CM | POA: Diagnosis not present

## 2020-06-17 DIAGNOSIS — M6281 Muscle weakness (generalized): Secondary | ICD-10-CM | POA: Diagnosis not present

## 2020-06-21 DIAGNOSIS — M25511 Pain in right shoulder: Secondary | ICD-10-CM | POA: Diagnosis not present

## 2020-06-21 DIAGNOSIS — M6281 Muscle weakness (generalized): Secondary | ICD-10-CM | POA: Diagnosis not present

## 2020-06-21 DIAGNOSIS — M25611 Stiffness of right shoulder, not elsewhere classified: Secondary | ICD-10-CM | POA: Diagnosis not present

## 2020-06-24 DIAGNOSIS — H04123 Dry eye syndrome of bilateral lacrimal glands: Secondary | ICD-10-CM | POA: Diagnosis not present

## 2020-06-24 DIAGNOSIS — Z961 Presence of intraocular lens: Secondary | ICD-10-CM | POA: Diagnosis not present

## 2020-06-24 DIAGNOSIS — H35372 Puckering of macula, left eye: Secondary | ICD-10-CM | POA: Diagnosis not present

## 2020-06-24 DIAGNOSIS — H18413 Arcus senilis, bilateral: Secondary | ICD-10-CM | POA: Diagnosis not present

## 2020-06-25 DIAGNOSIS — M25511 Pain in right shoulder: Secondary | ICD-10-CM | POA: Diagnosis not present

## 2020-06-25 DIAGNOSIS — M25611 Stiffness of right shoulder, not elsewhere classified: Secondary | ICD-10-CM | POA: Diagnosis not present

## 2020-06-25 DIAGNOSIS — M6281 Muscle weakness (generalized): Secondary | ICD-10-CM | POA: Diagnosis not present

## 2020-06-28 DIAGNOSIS — M6281 Muscle weakness (generalized): Secondary | ICD-10-CM | POA: Diagnosis not present

## 2020-06-28 DIAGNOSIS — M25511 Pain in right shoulder: Secondary | ICD-10-CM | POA: Diagnosis not present

## 2020-06-28 DIAGNOSIS — M25611 Stiffness of right shoulder, not elsewhere classified: Secondary | ICD-10-CM | POA: Diagnosis not present

## 2020-07-01 DIAGNOSIS — M25511 Pain in right shoulder: Secondary | ICD-10-CM | POA: Diagnosis not present

## 2020-07-01 DIAGNOSIS — M25611 Stiffness of right shoulder, not elsewhere classified: Secondary | ICD-10-CM | POA: Diagnosis not present

## 2020-07-01 DIAGNOSIS — M6281 Muscle weakness (generalized): Secondary | ICD-10-CM | POA: Diagnosis not present

## 2020-07-13 DIAGNOSIS — M6281 Muscle weakness (generalized): Secondary | ICD-10-CM | POA: Diagnosis not present

## 2020-07-13 DIAGNOSIS — M25611 Stiffness of right shoulder, not elsewhere classified: Secondary | ICD-10-CM | POA: Diagnosis not present

## 2020-07-13 DIAGNOSIS — M25511 Pain in right shoulder: Secondary | ICD-10-CM | POA: Diagnosis not present

## 2020-07-15 DIAGNOSIS — M6281 Muscle weakness (generalized): Secondary | ICD-10-CM | POA: Diagnosis not present

## 2020-07-15 DIAGNOSIS — M25611 Stiffness of right shoulder, not elsewhere classified: Secondary | ICD-10-CM | POA: Diagnosis not present

## 2020-07-15 DIAGNOSIS — H04122 Dry eye syndrome of left lacrimal gland: Secondary | ICD-10-CM | POA: Diagnosis not present

## 2020-07-15 DIAGNOSIS — M25511 Pain in right shoulder: Secondary | ICD-10-CM | POA: Diagnosis not present

## 2020-07-15 DIAGNOSIS — H04121 Dry eye syndrome of right lacrimal gland: Secondary | ICD-10-CM | POA: Diagnosis not present

## 2020-07-19 DIAGNOSIS — M6281 Muscle weakness (generalized): Secondary | ICD-10-CM | POA: Diagnosis not present

## 2020-07-19 DIAGNOSIS — M25611 Stiffness of right shoulder, not elsewhere classified: Secondary | ICD-10-CM | POA: Diagnosis not present

## 2020-07-19 DIAGNOSIS — M25511 Pain in right shoulder: Secondary | ICD-10-CM | POA: Diagnosis not present

## 2020-07-22 DIAGNOSIS — M25611 Stiffness of right shoulder, not elsewhere classified: Secondary | ICD-10-CM | POA: Diagnosis not present

## 2020-07-22 DIAGNOSIS — M6281 Muscle weakness (generalized): Secondary | ICD-10-CM | POA: Diagnosis not present

## 2020-07-22 DIAGNOSIS — M25511 Pain in right shoulder: Secondary | ICD-10-CM | POA: Diagnosis not present

## 2020-07-26 DIAGNOSIS — M25611 Stiffness of right shoulder, not elsewhere classified: Secondary | ICD-10-CM | POA: Diagnosis not present

## 2020-07-26 DIAGNOSIS — M25511 Pain in right shoulder: Secondary | ICD-10-CM | POA: Diagnosis not present

## 2020-07-26 DIAGNOSIS — M6281 Muscle weakness (generalized): Secondary | ICD-10-CM | POA: Diagnosis not present

## 2020-07-29 DIAGNOSIS — M25511 Pain in right shoulder: Secondary | ICD-10-CM | POA: Diagnosis not present

## 2020-07-29 DIAGNOSIS — M6281 Muscle weakness (generalized): Secondary | ICD-10-CM | POA: Diagnosis not present

## 2020-07-29 DIAGNOSIS — M25611 Stiffness of right shoulder, not elsewhere classified: Secondary | ICD-10-CM | POA: Diagnosis not present

## 2020-07-30 DIAGNOSIS — M19011 Primary osteoarthritis, right shoulder: Secondary | ICD-10-CM | POA: Diagnosis not present

## 2020-08-04 DIAGNOSIS — M25611 Stiffness of right shoulder, not elsewhere classified: Secondary | ICD-10-CM | POA: Diagnosis not present

## 2020-08-04 DIAGNOSIS — M6281 Muscle weakness (generalized): Secondary | ICD-10-CM | POA: Diagnosis not present

## 2020-08-04 DIAGNOSIS — M25511 Pain in right shoulder: Secondary | ICD-10-CM | POA: Diagnosis not present

## 2020-09-28 DIAGNOSIS — H04123 Dry eye syndrome of bilateral lacrimal glands: Secondary | ICD-10-CM | POA: Diagnosis not present

## 2020-09-28 DIAGNOSIS — H35372 Puckering of macula, left eye: Secondary | ICD-10-CM | POA: Diagnosis not present

## 2020-09-28 DIAGNOSIS — Z961 Presence of intraocular lens: Secondary | ICD-10-CM | POA: Diagnosis not present

## 2020-09-28 DIAGNOSIS — H18413 Arcus senilis, bilateral: Secondary | ICD-10-CM | POA: Diagnosis not present

## 2020-10-15 DIAGNOSIS — I10 Essential (primary) hypertension: Secondary | ICD-10-CM | POA: Diagnosis not present

## 2020-10-15 DIAGNOSIS — E785 Hyperlipidemia, unspecified: Secondary | ICD-10-CM | POA: Diagnosis not present

## 2020-10-15 DIAGNOSIS — R7309 Other abnormal glucose: Secondary | ICD-10-CM | POA: Diagnosis not present

## 2020-10-20 DIAGNOSIS — Z1231 Encounter for screening mammogram for malignant neoplasm of breast: Secondary | ICD-10-CM | POA: Diagnosis not present

## 2020-10-22 DIAGNOSIS — M5136 Other intervertebral disc degeneration, lumbar region: Secondary | ICD-10-CM | POA: Diagnosis not present

## 2020-10-22 DIAGNOSIS — Z23 Encounter for immunization: Secondary | ICD-10-CM | POA: Diagnosis not present

## 2020-10-22 DIAGNOSIS — I10 Essential (primary) hypertension: Secondary | ICD-10-CM | POA: Diagnosis not present

## 2020-10-22 DIAGNOSIS — E785 Hyperlipidemia, unspecified: Secondary | ICD-10-CM | POA: Diagnosis not present

## 2020-10-22 DIAGNOSIS — R7309 Other abnormal glucose: Secondary | ICD-10-CM | POA: Diagnosis not present

## 2020-10-22 DIAGNOSIS — F439 Reaction to severe stress, unspecified: Secondary | ICD-10-CM | POA: Diagnosis not present

## 2020-11-10 DIAGNOSIS — H35372 Puckering of macula, left eye: Secondary | ICD-10-CM | POA: Diagnosis not present

## 2020-11-10 DIAGNOSIS — H02831 Dermatochalasis of right upper eyelid: Secondary | ICD-10-CM | POA: Diagnosis not present

## 2020-11-10 DIAGNOSIS — H18413 Arcus senilis, bilateral: Secondary | ICD-10-CM | POA: Diagnosis not present

## 2020-11-10 DIAGNOSIS — H04123 Dry eye syndrome of bilateral lacrimal glands: Secondary | ICD-10-CM | POA: Diagnosis not present

## 2021-01-25 DIAGNOSIS — M5090 Cervical disc disorder, unspecified, unspecified cervical region: Secondary | ICD-10-CM | POA: Insufficient documentation

## 2021-01-25 DIAGNOSIS — H04302 Unspecified dacryocystitis of left lacrimal passage: Secondary | ICD-10-CM | POA: Diagnosis not present

## 2021-01-25 DIAGNOSIS — R7303 Prediabetes: Secondary | ICD-10-CM | POA: Insufficient documentation

## 2021-02-02 DIAGNOSIS — H04123 Dry eye syndrome of bilateral lacrimal glands: Secondary | ICD-10-CM | POA: Diagnosis not present

## 2021-02-02 DIAGNOSIS — H02833 Dermatochalasis of right eye, unspecified eyelid: Secondary | ICD-10-CM | POA: Diagnosis not present

## 2021-04-18 DIAGNOSIS — R7309 Other abnormal glucose: Secondary | ICD-10-CM | POA: Diagnosis not present

## 2021-04-18 DIAGNOSIS — R945 Abnormal results of liver function studies: Secondary | ICD-10-CM | POA: Diagnosis not present

## 2021-04-18 DIAGNOSIS — E785 Hyperlipidemia, unspecified: Secondary | ICD-10-CM | POA: Diagnosis not present

## 2021-04-18 DIAGNOSIS — I10 Essential (primary) hypertension: Secondary | ICD-10-CM | POA: Diagnosis not present

## 2021-04-25 DIAGNOSIS — M5090 Cervical disc disorder, unspecified, unspecified cervical region: Secondary | ICD-10-CM | POA: Diagnosis not present

## 2021-04-25 DIAGNOSIS — M19019 Primary osteoarthritis, unspecified shoulder: Secondary | ICD-10-CM | POA: Diagnosis not present

## 2021-04-25 DIAGNOSIS — J3089 Other allergic rhinitis: Secondary | ICD-10-CM | POA: Diagnosis not present

## 2021-04-25 DIAGNOSIS — I10 Essential (primary) hypertension: Secondary | ICD-10-CM | POA: Diagnosis not present

## 2021-04-25 DIAGNOSIS — R635 Abnormal weight gain: Secondary | ICD-10-CM | POA: Diagnosis not present

## 2021-04-25 DIAGNOSIS — E118 Type 2 diabetes mellitus with unspecified complications: Secondary | ICD-10-CM | POA: Diagnosis not present

## 2021-04-25 DIAGNOSIS — Z0000 Encounter for general adult medical examination without abnormal findings: Secondary | ICD-10-CM | POA: Diagnosis not present

## 2021-04-25 DIAGNOSIS — E785 Hyperlipidemia, unspecified: Secondary | ICD-10-CM | POA: Diagnosis not present

## 2021-04-25 DIAGNOSIS — Z789 Other specified health status: Secondary | ICD-10-CM | POA: Diagnosis not present

## 2021-08-05 ENCOUNTER — Other Ambulatory Visit: Payer: Self-pay | Admitting: *Deleted

## 2021-08-05 NOTE — Patient Outreach (Signed)
  Care Coordination   08/05/2021 Name: Amanda Merritt MRN: [id number] DOB: December 06, 1945   Care Coordination Outreach Attempts:  Contact was made with the patient today to offer care coordination services as a benefit of their health plan. The patient requested a return call on a later date. She stated this is not a good time.   Follow Up Plan:  Additional outreach attempts will be made to offer the patient care coordination information and services.   Encounter Outcome:  Pt. Request to Call Back  later in the month  Care Coordination Interventions Activated:  Yes   Care Coordination Interventions:  No, not indicated    [facility] [phone]

## 2021-08-31 ENCOUNTER — Ambulatory Visit: Payer: Self-pay | Admitting: *Deleted

## 2021-08-31 NOTE — Patient Outreach (Signed)
  Care Coordination   08/31/2021 Name: Amanda Merritt MRN: [id number] DOB: November 27, 1945   Care Coordination Outreach Attempts:  An unsuccessful telephone outreach was attempted today to offer the patient information about available care coordination services as a benefit of their health plan.   Follow Up Plan:  Additional outreach attempts will be made to offer the patient care coordination information and services.   Encounter Outcome:  No Answer  Care Coordination Interventions Activated:  Yes   Care Coordination Interventions:  No, not indicated    [facility] [phone]

## 2021-09-29 DIAGNOSIS — Z961 Presence of intraocular lens: Secondary | ICD-10-CM | POA: Diagnosis not present

## 2021-09-29 DIAGNOSIS — I10 Essential (primary) hypertension: Secondary | ICD-10-CM | POA: Diagnosis not present

## 2021-09-29 DIAGNOSIS — H35372 Puckering of macula, left eye: Secondary | ICD-10-CM | POA: Diagnosis not present

## 2021-09-29 DIAGNOSIS — H16223 Keratoconjunctivitis sicca, not specified as Sjogren's, bilateral: Secondary | ICD-10-CM | POA: Diagnosis not present

## 2021-10-19 DIAGNOSIS — E118 Type 2 diabetes mellitus with unspecified complications: Secondary | ICD-10-CM | POA: Diagnosis not present

## 2021-10-19 DIAGNOSIS — I10 Essential (primary) hypertension: Secondary | ICD-10-CM | POA: Diagnosis not present

## 2021-10-19 DIAGNOSIS — E785 Hyperlipidemia, unspecified: Secondary | ICD-10-CM | POA: Diagnosis not present

## 2021-10-25 DIAGNOSIS — M5136 Other intervertebral disc degeneration, lumbar region: Secondary | ICD-10-CM | POA: Diagnosis not present

## 2021-10-25 DIAGNOSIS — E559 Vitamin D deficiency, unspecified: Secondary | ICD-10-CM | POA: Diagnosis not present

## 2021-10-25 DIAGNOSIS — I10 Essential (primary) hypertension: Secondary | ICD-10-CM | POA: Diagnosis not present

## 2021-10-25 DIAGNOSIS — M5090 Cervical disc disorder, unspecified, unspecified cervical region: Secondary | ICD-10-CM | POA: Diagnosis not present

## 2021-10-25 DIAGNOSIS — E118 Type 2 diabetes mellitus with unspecified complications: Secondary | ICD-10-CM | POA: Diagnosis not present

## 2021-10-25 DIAGNOSIS — Z789 Other specified health status: Secondary | ICD-10-CM | POA: Diagnosis not present

## 2021-10-25 DIAGNOSIS — M19019 Primary osteoarthritis, unspecified shoulder: Secondary | ICD-10-CM | POA: Diagnosis not present

## 2021-10-25 DIAGNOSIS — J3089 Other allergic rhinitis: Secondary | ICD-10-CM | POA: Diagnosis not present

## 2021-10-25 DIAGNOSIS — E785 Hyperlipidemia, unspecified: Secondary | ICD-10-CM | POA: Diagnosis not present

## 2021-11-02 DIAGNOSIS — Z1231 Encounter for screening mammogram for malignant neoplasm of breast: Secondary | ICD-10-CM | POA: Diagnosis not present

## 2022-02-24 DIAGNOSIS — Z9049 Acquired absence of other specified parts of digestive tract: Secondary | ICD-10-CM | POA: Diagnosis not present

## 2022-02-24 DIAGNOSIS — N281 Cyst of kidney, acquired: Secondary | ICD-10-CM | POA: Diagnosis not present

## 2022-02-24 DIAGNOSIS — E860 Dehydration: Secondary | ICD-10-CM | POA: Diagnosis not present

## 2022-02-24 DIAGNOSIS — E876 Hypokalemia: Secondary | ICD-10-CM | POA: Diagnosis not present

## 2022-02-24 DIAGNOSIS — R7401 Elevation of levels of liver transaminase levels: Secondary | ICD-10-CM | POA: Diagnosis not present

## 2022-02-24 DIAGNOSIS — R9431 Abnormal electrocardiogram [ECG] [EKG]: Secondary | ICD-10-CM | POA: Diagnosis not present

## 2022-02-24 DIAGNOSIS — R112 Nausea with vomiting, unspecified: Secondary | ICD-10-CM | POA: Diagnosis not present

## 2022-02-24 DIAGNOSIS — E7800 Pure hypercholesterolemia, unspecified: Secondary | ICD-10-CM | POA: Diagnosis not present

## 2022-02-24 DIAGNOSIS — J9811 Atelectasis: Secondary | ICD-10-CM | POA: Diagnosis not present

## 2022-02-24 DIAGNOSIS — Z86010 Personal history of colonic polyps: Secondary | ICD-10-CM | POA: Diagnosis not present

## 2022-02-24 DIAGNOSIS — I700 Atherosclerosis of aorta: Secondary | ICD-10-CM | POA: Diagnosis not present

## 2022-02-24 DIAGNOSIS — K529 Noninfective gastroenteritis and colitis, unspecified: Secondary | ICD-10-CM | POA: Diagnosis not present

## 2022-02-24 DIAGNOSIS — R935 Abnormal findings on diagnostic imaging of other abdominal regions, including retroperitoneum: Secondary | ICD-10-CM | POA: Diagnosis not present

## 2022-02-24 DIAGNOSIS — K5730 Diverticulosis of large intestine without perforation or abscess without bleeding: Secondary | ICD-10-CM | POA: Diagnosis not present

## 2022-02-24 DIAGNOSIS — R10817 Generalized abdominal tenderness: Secondary | ICD-10-CM | POA: Diagnosis not present

## 2022-02-24 DIAGNOSIS — R531 Weakness: Secondary | ICD-10-CM | POA: Diagnosis not present

## 2022-02-24 DIAGNOSIS — R1011 Right upper quadrant pain: Secondary | ICD-10-CM | POA: Diagnosis not present

## 2022-02-24 DIAGNOSIS — R1084 Generalized abdominal pain: Secondary | ICD-10-CM | POA: Diagnosis not present

## 2022-02-24 DIAGNOSIS — A419 Sepsis, unspecified organism: Secondary | ICD-10-CM | POA: Diagnosis not present

## 2022-02-24 DIAGNOSIS — K838 Other specified diseases of biliary tract: Secondary | ICD-10-CM | POA: Diagnosis not present

## 2022-02-24 DIAGNOSIS — Z885 Allergy status to narcotic agent status: Secondary | ICD-10-CM | POA: Diagnosis not present

## 2022-02-24 DIAGNOSIS — I10 Essential (primary) hypertension: Secondary | ICD-10-CM | POA: Diagnosis not present

## 2022-02-24 DIAGNOSIS — K219 Gastro-esophageal reflux disease without esophagitis: Secondary | ICD-10-CM | POA: Diagnosis not present

## 2022-02-24 DIAGNOSIS — R110 Nausea: Secondary | ICD-10-CM | POA: Diagnosis not present

## 2022-02-27 LAB — LAB REPORT - SCANNED: eGFR: >60

## 2022-03-08 ENCOUNTER — Encounter: Payer: Self-pay | Admitting: Gastroenterology

## 2022-03-08 ENCOUNTER — Ambulatory Visit: Payer: Medicare PPO | Admitting: Gastroenterology

## 2022-03-08 ENCOUNTER — Other Ambulatory Visit (INDEPENDENT_AMBULATORY_CARE_PROVIDER_SITE_OTHER): Payer: Medicare PPO

## 2022-03-08 VITALS — BP 134/68 | HR 67 | Ht 65.0 in | Wt 188.4 lb

## 2022-03-08 DIAGNOSIS — Z86010 Personal history of colonic polyps: Secondary | ICD-10-CM | POA: Diagnosis not present

## 2022-03-08 DIAGNOSIS — K581 Irritable bowel syndrome with constipation: Secondary | ICD-10-CM

## 2022-03-08 DIAGNOSIS — A045 Campylobacter enteritis: Secondary | ICD-10-CM

## 2022-03-08 DIAGNOSIS — K219 Gastro-esophageal reflux disease without esophagitis: Secondary | ICD-10-CM

## 2022-03-08 DIAGNOSIS — H524 Presbyopia: Secondary | ICD-10-CM | POA: Diagnosis not present

## 2022-03-08 DIAGNOSIS — H16143 Punctate keratitis, bilateral: Secondary | ICD-10-CM | POA: Diagnosis not present

## 2022-03-08 DIAGNOSIS — Z8719 Personal history of other diseases of the digestive system: Secondary | ICD-10-CM | POA: Diagnosis not present

## 2022-03-08 DIAGNOSIS — Z961 Presence of intraocular lens: Secondary | ICD-10-CM | POA: Diagnosis not present

## 2022-03-08 LAB — COMPREHENSIVE METABOLIC PANEL
ALT: 16 U/L (ref 0–35)
AST: 12 U/L (ref 0–37)
Albumin: 4 g/dL (ref 3.5–5.2)
Alkaline Phosphatase: 62 U/L (ref 39–117)
BUN: 13 mg/dL (ref 6–23)
CO2: 25 mEq/L (ref 19–32)
Calcium: 9.8 mg/dL (ref 8.4–10.5)
Chloride: 104 mEq/L (ref 96–112)
Creatinine, Ser: 0.63 mg/dL (ref 0.40–1.20)
GFR: 85.98 mL/min (ref >60.00)
Glucose, Bld: 79 mg/dL (ref 70–99)
Potassium: 4 mEq/L (ref 3.5–5.1)
Sodium: 140 mEq/L (ref 135–145)
Total Bilirubin: 0.5 mg/dL (ref 0.2–1.2)
Total Protein: 6.9 g/dL (ref 6.0–8.3)

## 2022-03-08 LAB — CBC WITH DIFFERENTIAL/PLATELET
Basophils Absolute: 0.1 10*3/uL (ref 0.0–0.1)
Basophils Relative: 0.7 % (ref 0.0–3.0)
Eosinophils Absolute: 0.2 10*3/uL (ref 0.0–0.7)
Eosinophils Relative: 2.3 % (ref 0.0–5.0)
HCT: 40.8 % (ref 36.0–46.0)
Hemoglobin: 13.7 g/dL (ref 12.0–15.0)
Lymphocytes Relative: 29.3 % (ref 12.0–46.0)
Lymphs Abs: 2.3 10*3/uL (ref 0.7–4.0)
MCHC: 33.6 g/dL (ref 30.0–36.0)
MCV: 86.3 fl (ref 78.0–100.0)
Monocytes Absolute: 0.6 10*3/uL (ref 0.1–1.0)
Monocytes Relative: 7.1 % (ref 3.0–12.0)
Neutro Abs: 4.8 10*3/uL (ref 1.4–7.7)
Neutrophils Relative %: 60.6 % (ref 43.0–77.0)
Platelets: 399 10*3/uL (ref 150.0–400.0)
RBC: 4.73 Mil/uL (ref 3.87–5.11)
RDW: 13.7 % (ref 11.5–15.5)
WBC: 8 10*3/uL (ref 4.0–10.5)

## 2022-03-08 LAB — LIPASE: Lipase: 30 U/L (ref 11.0–59.0)

## 2022-03-08 MED ORDER — PANTOPRAZOLE SODIUM 40 MG PO TBEC: 40.0000 mg | DELAYED_RELEASE_TABLET | Freq: Every day | ORAL | 11 refills | Status: DC

## 2022-03-08 NOTE — Patient Instructions (Addendum)
_______________________________________________________  If your blood pressure at your visit was 140/90 or greater, please contact your primary care physician to follow up on this.  _______________________________________________________  If you are age 77 or older, your body mass index should be between 23-30. Your Body mass index is 31.35 kg/m. If this is out of the aforementioned range listed, please consider follow up with your Primary Care Provider.  If you are age 69 or younger, your body mass index should be between 19-25. Your Body mass index is 31.35 kg/m. If this is out of the aformentioned range listed, please consider follow up with your Primary Care Provider.   ________________________________________________________  The [facility] providers would like to encourage you to use [facility] to communicate with providers for non-urgent requests or questions.  Due to long hold times on the telephone, sending your provider a message by [facility] may be a faster and more efficient way to get a response.  Please allow 48 business hours for a response.  Please remember that this is for non-urgent requests.  _______________________________________________________  Your provider has requested that you go to the basement level for lab work before leaving today. Press "B" on the elevator. The lab is located at the first door on the left as you exit the elevator.  Continue Protonix  You have been scheduled for a colonoscopy. Please follow written instructions given to you at your visit today.  Please pick up your prep supplies at the pharmacy within the next 1-3 days. If you use inhalers (even only as needed), please bring them with you on the day of your procedure.  Two days before your procedure: Mix 3 packs (or capfuls) of Miralax in 48 ounces of clear liquid and drink at 6pm.  Thank you,  Dr. Jackquline Denmark

## 2022-03-08 NOTE — Progress Notes (Signed)
Chief Complaint:   Referring Provider:  Janie Morning, DO      ASSESSMENT AND PLAN;   #1. Recent camplobacter GEitis  #2. H/O polyps  #3. GERD  #4. IBS-C  Plan: -Continue protonix '40mg'$  QD #30, 11RF -CBC, CMP, lipase -Colon in 6-8 weeks with a 2-day prep. -D/C summary from [facility] -Discussed extensively with the patient and patient's family.   HPI:    Amanda Merritt is a 77 y.o. female  With HTN, HLD, pre-DM, OA, IBS-C, S/P   Adm to RH with Campyobacter Geitis 2/23-2/26, given levaquin with good results. Also ultrasound/CT showed mild dilatation of biliary tree without any obvious stones.  Note that she is s/p cholecystectomy in 2003.  MRCP was negative for CBD stones.  N/V is better. Diarrhea has completely resolved. Now back to baseline constipation.  Better with shredded wheat/coffee No melena or hematochezia. Minimal abdominal discomfort with mild bloating.  No significant heartburn on protonix    Wt Readings from Last 3 Encounters:  03/08/22 188 lb 6 oz (85.4 kg)  05/05/20 198 lb 6.6 oz (90 kg)  03/19/19 191 lb (86.6 kg)   Past GI workup: -US/CT/MRCP as above 02/2022: Postcholecystectomy dilatation of CBD.  No obvious filling defects.  Colonoscopy 03/2019 -Colonic polyps s/p polypectomy (Bx-tubular adenomas). -Pancolonic diverticulosis predominantly in the sigmoid colon. -Otherwise normal colonoscopy to TI. Of note that patient got 2-day prep    Past Medical History:  Diagnosis Date   Adenomatous polyps    Allergy    Anemia    with pregnancy    Arthritis    GERD (gastroesophageal reflux disease)    History of colon polyps    History of COVID-19    received infusion therapy   Hyperlipidemia    Hypertension    IBS (irritable bowel syndrome)    alternates with constipation and loose stools- takes fiber daily    Postmenopause    Prediabetes    controlled with diet and exercise    Past Surgical History:  Procedure Laterality Date   ABDOMINAL  HYSTERECTOMY     left ovaries    bladder tac     BREAST BIOPSY     due to bening reasons   CATARACT EXTRACTION, BILATERAL     CHOLECYSTECTOMY     COLONOSCOPY  01/17/2016   Colonic polyps status post polypectomy. Pancolonic diverticulosis predominantly in the sigmoid colon. Small internal hemorrhoids   POLYPECTOMY     REVERSE SHOULDER ARTHROPLASTY Right 05/05/2020   Procedure: REVERSE SHOULDER ARTHROPLASTY;  Surgeon: Hiram Gash, MD;  Location: [place];  Service: Orthopedics;  Laterality: Right;    Family History  Problem Relation Age of Onset   Stomach cancer Paternal 71    Breast cancer Paternal Aunt    Breast cancer Paternal Aunt    Colon cancer Neg Hx    Colon polyps Neg Hx    Esophageal cancer Neg Hx    Rectal cancer Neg Hx     Social History   Tobacco Use   Smoking status: Former   Smokeless tobacco: Never   Tobacco comments:    quit early 20's  Vaping Use   Vaping Use: Never used  Substance Use Topics   Alcohol use: Not Currently   Drug use: Never    Current Outpatient Medications  Medication Sig Dispense Refill   aspirin (ASPIRIN CHILDRENS) 81 MG chewable tablet Chew 1 tablet (81 mg total) by mouth 2 (two) times daily. For 6 weeks. For DVT  prophylaxis after surgery 84 tablet 0   calcium carbonate (TUMS - DOSED IN MG ELEMENTAL CALCIUM) 500 MG chewable tablet Chew 1 tablet by mouth as needed for indigestion or heartburn.     Cholecalciferol 25 MCG (1000 UT) tablet Take by mouth.     dicyclomine (BENTYL) 10 MG capsule Take 10 mg by mouth every 6 (six) hours.     docusate sodium (COLACE) 100 MG capsule Take 100 mg by mouth 2 (two) times daily. [employer] brand of generic     FIBER COMPLETE PO Take by mouth daily.     levofloxacin (LEVAQUIN) 750 MG tablet Take 750 mg by mouth daily.     magnesium gluconate (MAGONATE) 500 MG tablet Take by mouth.     Multiple Vitamins-Minerals (ALIVE WOMENS ENERGY) TABS Take by mouth.     Omega-3 Fatty Acids (FISH  OIL) 1000 MG CAPS Take 2,000 mg by mouth in the morning and at bedtime.     pantoprazole (PROTONIX) 40 MG tablet Take 40 mg by mouth daily.     Probiotic Product (PROBIOTIC PO) Take 1 tablet by mouth daily.     Ursodiol 200 MG CAPS Take 1 capsule by mouth daily.     valsartan (DIOVAN) 320 MG tablet Take 320 mg by mouth daily.     No current facility-administered medications for this visit.    Allergies  Allergen Reactions   Codeine Itching and Other (See Comments)    unknown   Other Other (See Comments)    Lumps over body   Atorvastatin Other (See Comments)    Unknown, cramping and lumps in arms and legs   Diazepam Other (See Comments)    Too tired the next day, intolerance    Ezetimibe Other (See Comments)    unknown    Review of Systems:  neg     Physical Exam:    BP 134/68   Pulse 67   Ht '5\' 5"'$  (1.651 m)   Wt 188 lb 6 oz (85.4 kg)   BMI 31.35 kg/m  Wt Readings from Last 3 Encounters:  03/08/22 188 lb 6 oz (85.4 kg)  05/05/20 198 lb 6.6 oz (90 kg)  03/19/19 191 lb (86.6 kg)   Constitutional:  Well-developed, in no acute distress. Psychiatric: Normal mood and affect. Behavior is normal. HEENT: Pupils normal.  Conjunctivae are normal. No scleral icterus. Cardiovascular: Normal rate, regular rhythm. No edema Pulmonary/chest: Effort normal and breath sounds normal. No wheezing, rales or rhonchi. Abdominal: Soft, nondistended. Nontender. Bowel sounds active throughout. There are no masses palpable. No hepatomegaly. Rectal: Deferred Neurological: Alert and oriented to person place and time. Skin: Skin is warm and dry. No rashes noted.  Data Reviewed: I have personally reviewed following labs and imaging studies  CBC:     No data to display          CMP:    Latest Ref Rng & Units 05/03/2020    2:30 PM  CMP  Glucose 70 - 99 mg/dL 118   BUN 8 - 23 mg/dL 16   Creatinine 0.44 - 1.00 mg/dL 0.74   Sodium 135 - 145 mmol/L 137   Potassium 3.5 - 5.1 mmol/L 3.8    Chloride 98 - 111 mmol/L 101   CO2 22 - 32 mmol/L 29   Calcium 8.9 - 10.3 mg/dL 9.4         Carmell Austria, MD 03/08/2022, 11:41 AM  Cc: Janie Morning, DO

## 2022-03-13 ENCOUNTER — Telehealth: Payer: Self-pay | Admitting: Gastroenterology

## 2022-03-13 NOTE — Telephone Encounter (Signed)
Pt made aware of recent results: Pt verbalized understanding with all questions answered.   

## 2022-03-13 NOTE — Telephone Encounter (Signed)
PT is calling to get results. Please advise

## 2022-03-21 DIAGNOSIS — C44229 Squamous cell carcinoma of skin of left ear and external auricular canal: Secondary | ICD-10-CM | POA: Diagnosis not present

## 2022-03-21 DIAGNOSIS — L720 Epidermal cyst: Secondary | ICD-10-CM | POA: Diagnosis not present

## 2022-03-21 DIAGNOSIS — L821 Other seborrheic keratosis: Secondary | ICD-10-CM | POA: Diagnosis not present

## 2022-04-18 DIAGNOSIS — Z789 Other specified health status: Secondary | ICD-10-CM | POA: Diagnosis not present

## 2022-04-18 DIAGNOSIS — E785 Hyperlipidemia, unspecified: Secondary | ICD-10-CM | POA: Diagnosis not present

## 2022-04-18 DIAGNOSIS — E559 Vitamin D deficiency, unspecified: Secondary | ICD-10-CM | POA: Diagnosis not present

## 2022-04-18 DIAGNOSIS — M5136 Other intervertebral disc degeneration, lumbar region: Secondary | ICD-10-CM | POA: Diagnosis not present

## 2022-04-18 DIAGNOSIS — M19019 Primary osteoarthritis, unspecified shoulder: Secondary | ICD-10-CM | POA: Diagnosis not present

## 2022-04-18 DIAGNOSIS — I10 Essential (primary) hypertension: Secondary | ICD-10-CM | POA: Diagnosis not present

## 2022-04-18 DIAGNOSIS — E118 Type 2 diabetes mellitus with unspecified complications: Secondary | ICD-10-CM | POA: Diagnosis not present

## 2022-04-18 DIAGNOSIS — M5090 Cervical disc disorder, unspecified, unspecified cervical region: Secondary | ICD-10-CM | POA: Diagnosis not present

## 2022-04-18 DIAGNOSIS — J3089 Other allergic rhinitis: Secondary | ICD-10-CM | POA: Diagnosis not present

## 2022-04-25 DIAGNOSIS — J3089 Other allergic rhinitis: Secondary | ICD-10-CM | POA: Diagnosis not present

## 2022-04-25 DIAGNOSIS — Z789 Other specified health status: Secondary | ICD-10-CM | POA: Diagnosis not present

## 2022-04-25 DIAGNOSIS — I10 Essential (primary) hypertension: Secondary | ICD-10-CM | POA: Diagnosis not present

## 2022-04-25 DIAGNOSIS — E118 Type 2 diabetes mellitus with unspecified complications: Secondary | ICD-10-CM | POA: Diagnosis not present

## 2022-04-25 DIAGNOSIS — E559 Vitamin D deficiency, unspecified: Secondary | ICD-10-CM | POA: Diagnosis not present

## 2022-04-25 DIAGNOSIS — M5090 Cervical disc disorder, unspecified, unspecified cervical region: Secondary | ICD-10-CM | POA: Diagnosis not present

## 2022-04-25 DIAGNOSIS — Z0000 Encounter for general adult medical examination without abnormal findings: Secondary | ICD-10-CM | POA: Diagnosis not present

## 2022-04-25 DIAGNOSIS — E785 Hyperlipidemia, unspecified: Secondary | ICD-10-CM | POA: Diagnosis not present

## 2022-04-25 DIAGNOSIS — M5136 Other intervertebral disc degeneration, lumbar region: Secondary | ICD-10-CM | POA: Diagnosis not present

## 2022-04-26 ENCOUNTER — Encounter: Payer: Self-pay | Admitting: Gastroenterology

## 2022-04-30 ENCOUNTER — Encounter: Payer: Self-pay | Admitting: Certified Registered Nurse Anesthetist

## 2022-05-01 ENCOUNTER — Telehealth: Payer: Self-pay | Admitting: Gastroenterology

## 2022-05-01 NOTE — Telephone Encounter (Signed)
PT is experiencing meal induced indigestion and very concerned. She is taking pantoprazole and feels it isnt working. Please advise.

## 2022-05-05 ENCOUNTER — Ambulatory Visit (AMBULATORY_SURGERY_CENTER): Payer: Medicare PPO | Admitting: Gastroenterology

## 2022-05-05 ENCOUNTER — Encounter: Payer: Self-pay | Admitting: Gastroenterology

## 2022-05-05 VITALS — BP 112/64 | HR 50 | Temp 97.3°F | Resp 12 | Ht 65.0 in | Wt 188.0 lb

## 2022-05-05 DIAGNOSIS — Z09 Encounter for follow-up examination after completed treatment for conditions other than malignant neoplasm: Secondary | ICD-10-CM

## 2022-05-05 DIAGNOSIS — K219 Gastro-esophageal reflux disease without esophagitis: Secondary | ICD-10-CM | POA: Diagnosis not present

## 2022-05-05 DIAGNOSIS — Z86010 Personal history of colonic polyps: Secondary | ICD-10-CM | POA: Diagnosis not present

## 2022-05-05 DIAGNOSIS — D123 Benign neoplasm of transverse colon: Secondary | ICD-10-CM

## 2022-05-05 DIAGNOSIS — D122 Benign neoplasm of ascending colon: Secondary | ICD-10-CM | POA: Diagnosis not present

## 2022-05-05 MED ORDER — SODIUM CHLORIDE 0.9 % IV SOLN: 500.0000 mL | Freq: Once | INTRAVENOUS | Status: AC

## 2022-05-05 MED ORDER — OMEPRAZOLE 40 MG PO CPDR
40.0000 mg | DELAYED_RELEASE_CAPSULE | Freq: Two times a day (BID) | ORAL | 6 refills | Status: AC
Start: 2022-05-05 — End: ?

## 2022-05-05 NOTE — Progress Notes (Signed)
Chief Complaint:   Referring Provider:  Irena Reichmann, DO      ASSESSMENT AND PLAN;   #1. Recent camplobacter GEitis  #2. H/O polyps  #3. GERD  #4. IBS-C  Plan: -Continue protonix 40mg  QD #30, 11RF -CBC, CMP, lipase -Colon in 6-8 weeks with a 2-day prep. -D/C summary from [facility] -Discussed extensively with the patient and patient's family.   HPI:    Amanda Merritt is a 77 y.o. female  With HTN, HLD, pre-DM, OA, IBS-C, S/P   Adm to RH with Campyobacter Geitis 2/23-2/26, given levaquin with good results. Also ultrasound/CT showed mild dilatation of biliary tree without any obvious stones.  Note that she is s/p cholecystectomy in 2003.  MRCP was negative for CBD stones.  N/V is better. Diarrhea has completely resolved. Now back to baseline constipation.  Better with shredded wheat/coffee No melena or hematochezia. Minimal abdominal discomfort with mild bloating.  No significant heartburn on protonix    Wt Readings from Last 3 Encounters:  05/05/22 188 lb (85.3 kg)  03/08/22 188 lb 6 oz (85.4 kg)  05/05/20 198 lb 6.6 oz (90 kg)   Past GI workup: -US/CT/MRCP as above 02/2022: Postcholecystectomy dilatation of CBD.  No obvious filling defects.  Colonoscopy 03/2019 -Colonic polyps s/p polypectomy (Bx-tubular adenomas). -Pancolonic diverticulosis predominantly in the sigmoid colon. -Otherwise normal colonoscopy to TI. Of note that patient got 2-day prep    Past Medical History:  Diagnosis Date   Adenomatous polyps    Allergy    Anemia    with pregnancy    Arthritis    GERD (gastroesophageal reflux disease)    History of colon polyps    History of COVID-19    received infusion therapy   Hyperlipidemia    Hypertension    IBS (irritable bowel syndrome)    alternates with constipation and loose stools- takes fiber daily    Postmenopause    Prediabetes    controlled with diet and exercise    Past Surgical History:  Procedure Laterality Date   ABDOMINAL  HYSTERECTOMY     left ovaries    bladder tac     BREAST BIOPSY     due to bening reasons   CATARACT EXTRACTION, BILATERAL     CHOLECYSTECTOMY     COLONOSCOPY  01/17/2016   Colonic polyps status post polypectomy. Pancolonic diverticulosis predominantly in the sigmoid colon. Small internal hemorrhoids   POLYPECTOMY     REVERSE SHOULDER ARTHROPLASTY Right 05/05/2020   Procedure: REVERSE SHOULDER ARTHROPLASTY;  Surgeon: Bjorn Pippin, MD;  Location: [facility];  Service: Orthopedics;  Laterality: Right;    Family History  Problem Relation Age of Onset   Stomach cancer Paternal Aunt    Breast cancer Paternal Aunt    Breast cancer Paternal Aunt    Colon cancer Neg Hx    Colon polyps Neg Hx    Esophageal cancer Neg Hx    Rectal cancer Neg Hx     Social History   Tobacco Use   Smoking status: Former   Smokeless tobacco: Never   Tobacco comments:    quit early 20's  Vaping Use   Vaping Use: Never used  Substance Use Topics   Alcohol use: Not Currently   Drug use: Never    Current Outpatient Medications  Medication Sig Dispense Refill   aspirin (ASPIRIN CHILDRENS) 81 MG chewable tablet Chew 1 tablet (81 mg total) by mouth 2 (two) times daily. For 6 weeks. For DVT  prophylaxis after surgery 84 tablet 0   calcium carbonate (TUMS - DOSED IN MG ELEMENTAL CALCIUM) 500 MG chewable tablet Chew 1 tablet by mouth as needed for indigestion or heartburn.     Cholecalciferol 25 MCG (1000 UT) tablet Take by mouth.     dicyclomine (BENTYL) 10 MG capsule Take 10 mg by mouth every 6 (six) hours.     FIBER COMPLETE PO Take by mouth daily.     magnesium gluconate (MAGONATE) 500 MG tablet Take by mouth.     Multiple Vitamins-Minerals (ALIVE WOMENS ENERGY) TABS Take by mouth.     Omega-3 Fatty Acids (FISH OIL) 1000 MG CAPS Take 2,000 mg by mouth in the morning and at bedtime.     omeprazole (PRILOSEC) 40 MG capsule Take 1 capsule (40 mg total) by mouth 2 (two) times daily. 180  capsule 6   Probiotic Product (PROBIOTIC PO) Take 1 tablet by mouth daily.     valsartan (DIOVAN) 320 MG tablet Take 320 mg by mouth daily.     docusate sodium (COLACE) 100 MG capsule Take 100 mg by mouth 2 (two) times daily. [employer] brand of generic     levofloxacin (LEVAQUIN) 750 MG tablet Take 750 mg by mouth daily. (Patient not taking: Reported on 05/05/2022)     Ursodiol 200 MG CAPS Take 1 capsule by mouth daily. (Patient not taking: Reported on 05/05/2022)     Current Facility-Administered Medications  Medication Dose Route Frequency Provider Last Rate Last Admin   0.9 %  sodium chloride infusion  500 mL Intravenous Once Lynann Bologna, MD        Allergies  Allergen Reactions   Codeine Itching and Other (See Comments)    unknown   Other Other (See Comments)    Lumps over body   Atorvastatin Other (See Comments)    Unknown, cramping and lumps in arms and legs   Diazepam Other (See Comments)    Too tired the next day, intolerance    Ezetimibe Other (See Comments)    unknown    Review of Systems:  neg     Physical Exam:    BP 112/64   Pulse (!) 50   Temp (!) 97.3 F (36.3 C)   Resp 12   Ht 5\' 5"  (1.651 m)   Wt 188 lb (85.3 kg)   SpO2 97%   BMI 31.28 kg/m  Wt Readings from Last 3 Encounters:  05/05/22 188 lb (85.3 kg)  03/08/22 188 lb 6 oz (85.4 kg)  05/05/20 198 lb 6.6 oz (90 kg)   Constitutional:  Well-developed, in no acute distress. Psychiatric: Normal mood and affect. Behavior is normal. HEENT: Pupils normal.  Conjunctivae are normal. No scleral icterus. Cardiovascular: Normal rate, regular rhythm. No edema Pulmonary/chest: Effort normal and breath sounds normal. No wheezing, rales or rhonchi. Abdominal: Soft, nondistended. Nontender. Bowel sounds active throughout. There are no masses palpable. No hepatomegaly. Rectal: Deferred Neurological: Alert and oriented to person place and time. Skin: Skin is warm and dry. No rashes noted.  Data Reviewed: I have  personally reviewed following labs and imaging studies  CBC:    Latest Ref Rng & Units 03/08/2022   12:25 PM  CBC  WBC 4.0 - 10.5 K/uL 8.0   Hemoglobin 12.0 - 15.0 g/dL 40.9   Hematocrit 81.1 - 46.0 % 40.8   Platelets 150.0 - 400.0 K/uL 399.0     CMP:    Latest Ref Rng & Units 03/08/2022   12:25 PM 05/03/2020  2:30 PM  CMP  Glucose 70 - 99 mg/dL 79  960   BUN 6 - 23 mg/dL 13  16   Creatinine 4.54 - 1.20 mg/dL 0.98  1.19   Sodium 147 - 145 mEq/L 140  137   Potassium 3.5 - 5.1 mEq/L 4.0  3.8   Chloride 96 - 112 mEq/L 104  101   CO2 19 - 32 mEq/L 25  29   Calcium 8.4 - 10.5 mg/dL 9.8  9.4   Total Protein 6.0 - 8.3 g/dL 6.9    Total Bilirubin 0.2 - 1.2 mg/dL 0.5    Alkaline Phos 39 - 117 U/L 62    AST 0 - 37 U/L 12    ALT 0 - 35 U/L 16      for colon today     Edman Circle, MD 05/05/2022, 12:33 PM  Cc: Irena Reichmann, DO

## 2022-05-05 NOTE — Progress Notes (Signed)
Called to room to assist during endoscopic procedure.  Patient ID and intended procedure confirmed with present staff. Received instructions for my participation in the procedure from the performing physician.  

## 2022-05-05 NOTE — Op Note (Addendum)
[facility] Patient Name: Amanda Merritt Procedure Date: 05/05/2022 9:21 AM MRN: [id number] Endoscopist: Lynann Bologna , MD, [id number] Age: 77 Referring MD:  Date of Birth: 1945-08-03 Gender: Female Account #: [id number] Procedure:                Colonoscopy Indications:              High risk colon cancer surveillance: Personal                            history of colonic polyps Medicines:                Monitored Anesthesia Care Procedure:                Pre-Anesthesia Assessment:                           - Prior to the procedure, a History and Physical                            was performed, and patient medications and                            allergies were reviewed. The patient's tolerance of                            previous anesthesia was also reviewed. The risks                            and benefits of the procedure and the sedation                            options and risks were discussed with the patient.                            All questions were answered, and informed consent                            was obtained. Prior Anticoagulants: The patient has                            taken no anticoagulant or antiplatelet agents. ASA                            Grade Assessment: II - A patient with mild systemic                            disease. After reviewing the risks and benefits,                            the patient was deemed in satisfactory condition to                            undergo the procedure.  After obtaining informed consent, the colonoscope                            was passed under direct vision. Throughout the                            procedure, the patient's blood pressure, pulse, and                            oxygen saturations were monitored continuously. The                            Olympus CF-HQ190L [id number]) Colonoscope was                            introduced through the anus and advanced to  the 2                            cm into the ileum. The colonoscopy was performed                            without difficulty. The patient tolerated the                            procedure well. The quality of the bowel                            preparation was good. The terminal ileum, ileocecal                            valve, appendiceal orifice, and rectum were                            photographed. Scope In: 9:38:26 AM Scope Out: 9:53:54 AM Scope Withdrawal Time: 0 hours 11 minutes 37 seconds  Total Procedure Duration: 0 hours 15 minutes 28 seconds  Findings:                 Two sessile polyps were found in the proximal                            ascending colon and mid ascending colon. The polyps                            were 4 to 6 mm in size. These polyps were removed                            with a cold snare. Resection and retrieval were                            complete.                           A 2 mm polyp was found in the proximal ascending  colon. The polyp was sessile. The polyp was removed                            with a cold biopsy forceps. Resection and retrieval                            were complete.                           Multiple medium-mouthed diverticula were found in                            the sigmoid colon, few in descending colon and                            ascending colon. It would give sigmoid colon a                            "Swiss cheese appearance". There was luminal                            narrowing consistent with muscular hypertrophy. No                            endoscopic evidence of diverticulitis or scad.                            Majority of the diverticula were within 40 cm from                            the anal verge on withdrawal of the scope.                           Non-bleeding internal hemorrhoids were found during                            retroflexion. The hemorrhoids were  small and Grade                            I (internal hemorrhoids that do not prolapse).                           The terminal ileum appeared normal.                           The exam was otherwise without abnormality on                            direct and retroflexion views. Complications:            No immediate complications. Estimated Blood Loss:     Estimated blood loss: none. Impression:               - Two 4 to 6 mm polyps in the proximal ascending  colon and in the mid ascending colon, removed with                            a cold snare. Resected and retrieved.                           - One 2 mm polyp in the proximal ascending colon,                            removed with a cold biopsy forceps. Resected and                            retrieved.                           - Moderate to severe predominantly sigmoid                            diverticulosis                           - Non-bleeding internal hemorrhoids.                           - The examined portion of the ileum was normal.                           - The examination was otherwise normal on direct                            and retroflexion views.                           - The GI Genius (intelligent endoscopy module),                            computer-aided polyp detection system powered by AI                            was utilized to detect colorectal polyps through                            enhanced visualization during colonoscopy. Recommendation:           - Patient has a contact number available for                            emergencies. The signs and symptoms of potential                            delayed complications were discussed with the                            patient. Return to normal activities tomorrow.  Written discharge instructions were provided to the                            patient.                           - High fiber  diet.                           - Continue present medications.                           - Await pathology results.                           - Repeat colonoscopy is not recommended for                            surveillance.                           - For reflux, stop pantoprazole. Start omeprazole                            40 mg p.o. twice daily #90, 6RF. She will let us                            know if she still has problems.                           - The findings and recommendations were discussed                            with the patient's family. Lynann Bologna, MD 05/05/2022 9:59:59 AM This report has been signed electronically.

## 2022-05-05 NOTE — Patient Instructions (Signed)
Discharge instructions given. Handouts on polyps,diverticulosis and hemorrhoids. Prescription sent to pharmacy. Resume previous medications. YOU HAD AN ENDOSCOPIC PROCEDURE TODAY AT THE [facility]:   Refer to the procedure report that was given to you for any specific questions about what was found during the examination.  If the procedure report does not answer your questions, please call your gastroenterologist to clarify.  If you requested that your care partner not be given the details of your procedure findings, then the procedure report has been included in a sealed envelope for you to review at your convenience later.  YOU SHOULD EXPECT: Some feelings of bloating in the abdomen. Passage of more gas than usual.  Walking can help get rid of the air that was put into your GI tract during the procedure and reduce the bloating. If you had a lower endoscopy (such as a colonoscopy or flexible sigmoidoscopy) you may notice spotting of blood in your stool or on the toilet paper. If you underwent a bowel prep for your procedure, you may not have a normal bowel movement for a few days.  Please Note:  You might notice some irritation and congestion in your nose or some drainage.  This is from the oxygen used during your procedure.  There is no need for concern and it should clear up in a day or so.  SYMPTOMS TO REPORT IMMEDIATELY:  Following lower endoscopy (colonoscopy or flexible sigmoidoscopy):  Excessive amounts of blood in the stool  Significant tenderness or worsening of abdominal pains  Swelling of the abdomen that is new, acute  Fever of 100F or higher   For urgent or emergent issues, a gastroenterologist can be reached at any hour by calling [phone]. Do not use MyChart messaging for urgent concerns.    DIET:  We do recommend a small meal at first, but then you may proceed to your regular diet.  Drink plenty of fluids but you should avoid alcoholic beverages for 24  hours.  ACTIVITY:  You should plan to take it easy for the rest of today and you should NOT DRIVE or use heavy machinery until tomorrow (because of the sedation medicines used during the test).    FOLLOW UP: Our staff will call the number listed on your records the next business day following your procedure.  We will call around 7:15- 8:00 am to check on you and address any questions or concerns that you may have regarding the information given to you following your procedure. If we do not reach you, we will leave a message.     If any biopsies were taken you will be contacted by phone or by letter within the next 1-3 weeks.  Please call us at [phone] if you have not heard about the biopsies in 3 weeks.    SIGNATURES/CONFIDENTIALITY: You and/or your care partner have signed paperwork which will be entered into your electronic medical record.  These signatures attest to the fact that that the information above on your After Visit Summary has been reviewed and is understood.  Full responsibility of the confidentiality of this discharge information lies with you and/or your care-partner.

## 2022-05-05 NOTE — Progress Notes (Signed)
Report given to PACU, vss 

## 2022-05-08 ENCOUNTER — Telehealth: Payer: Self-pay

## 2022-05-08 NOTE — Telephone Encounter (Signed)
  Follow up Call-     05/05/2022    8:41 AM  Call back number  Post procedure Call Back phone  # [phone]  Permission to leave phone message Yes     Patient questions:  Do you have a fever, pain , or abdominal swelling? No. Pain Score  0 *  Have you tolerated food without any problems? Yes.    Have you been able to return to your normal activities? Yes.    Do you have any questions about your discharge instructions: Diet   No. Medications  No. Follow up visit  No.  Do you have questions or concerns about your Care? No.  Actions: * If pain score is 4 or above: No action needed, pain <4.

## 2022-05-09 ENCOUNTER — Encounter: Payer: Self-pay | Admitting: Gastroenterology

## 2022-10-23 IMAGING — MR MR SHOULDER*R* WO/W CM
8 series · 40 of 40 positions shown · IV contrast (multihance)
Comparison: Radiographs 01/14/2020

CLINICAL DATA: Right shoulder pain. Pulling injury. Palpable area
of tenderness on the top of the shoulder.

EXAM:
MRI OF THE RIGHT SHOULDER WITHOUT AND WITH CONTRAST
TECHNIQUE: Multiplanar, multisequence MR imaging of the right shoulder was
performed before and after the administration of intravenous
contrast.
CONTRAST:  19mL MULTIHANCE GADOBENATE DIMEGLUMINE 529 MG/ML IV SOLN

[Series 6: T2 fat-sat · axial · right · 3.0mm · 0.47mm/px · z∈[-32,+60]mm · 5 of 27 slices shown (1 of 3)]
[im 1/27]
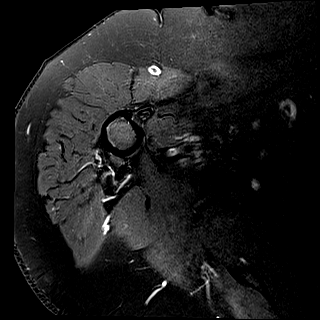
[im 7/27]
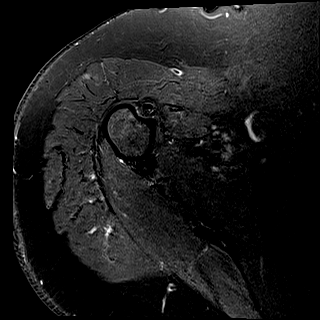
[im 14/27]
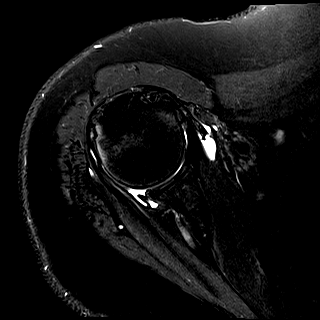
[im 20/27]
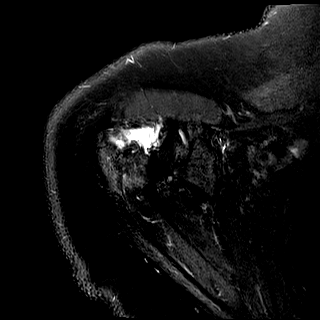
[im 27/27]
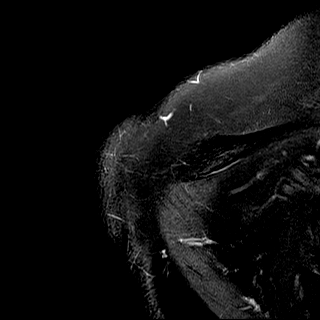

[Series 7: T2 fat-sat · oblique · right · 3.0mm · 0.44mm/px · 5 of 28 slices shown (2 of 3)]
[im 1/28]
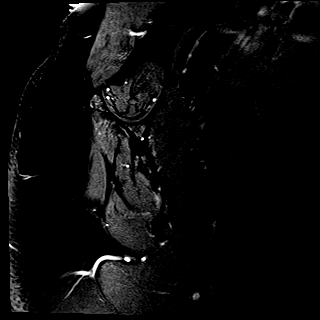
[im 7/28]
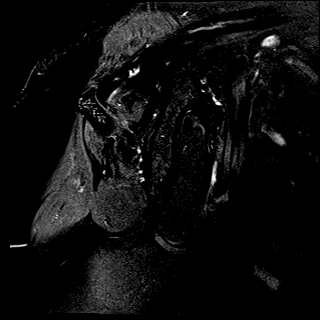
[im 14/28]
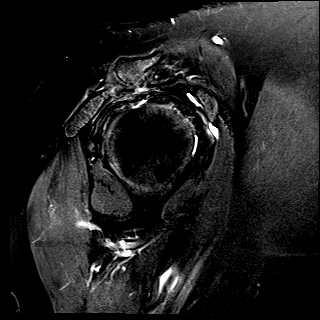
[im 21/28]
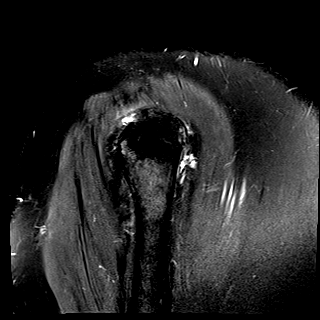
[im 28/28]
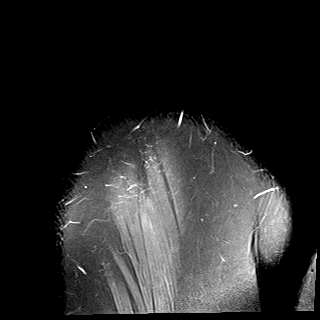

[Series 8: T1 · oblique · right · 3.0mm · 0.55mm/px · 5 of 28 slices shown]
[im 1/28]
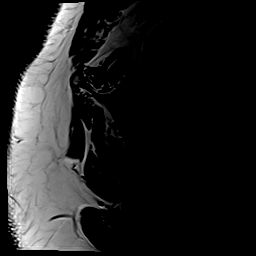
[im 7/28]
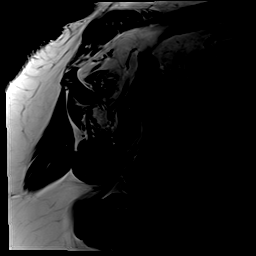
[im 14/28]
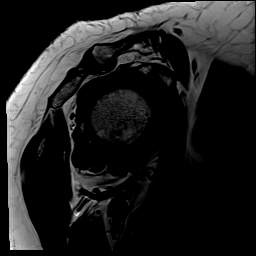
[im 21/28]
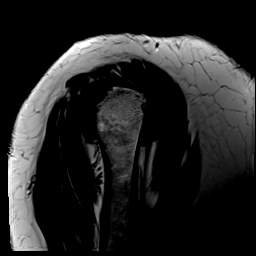
[im 28/28]
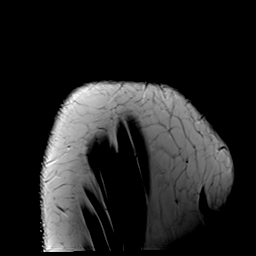

[Series 9: PD · oblique · right · 3.0mm · 0.44mm/px · 5 of 26 slices shown]
[im 1/26]
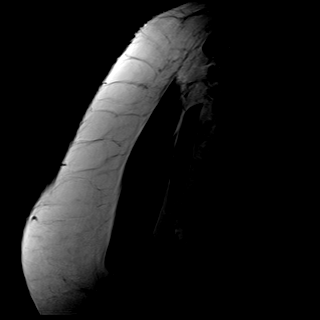
[im 7/26]
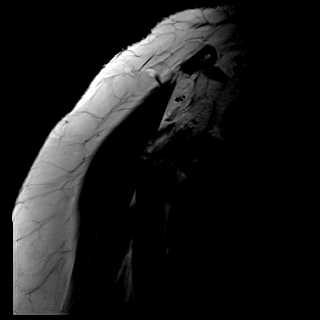
[im 13/26]
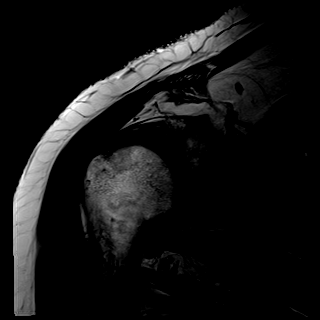
[im 19/26]
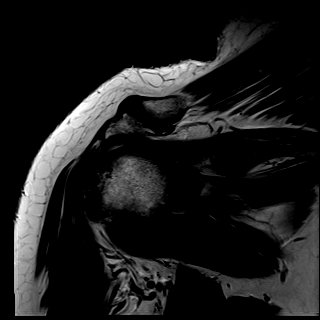
[im 26/26]
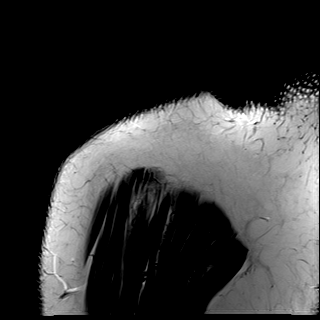

[Series 10: T2 fat-sat · oblique · right · 3.0mm · 0.44mm/px · 5 of 26 slices shown (3 of 3)]
[im 1/26]
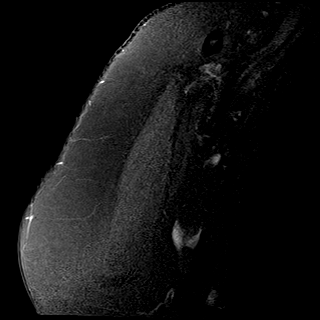
[im 7/26]
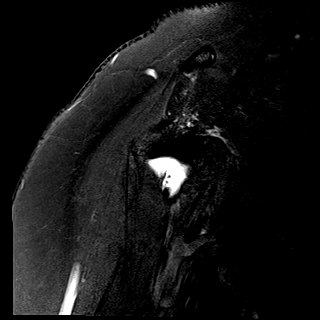
[im 13/26]
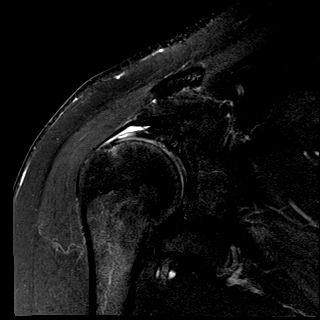
[im 19/26]
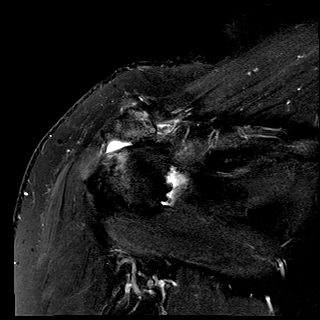
[im 26/26]
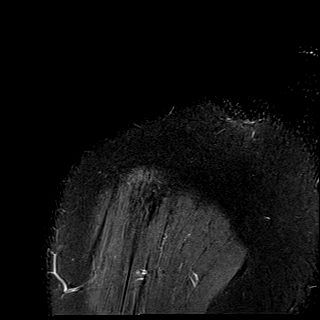

[Series 11: T1 fat-sat · axial · non-contrast · right · 3.0mm · 0.50mm/px · z∈[-31,+61]mm · 5 of 27 slices shown]
[im 1/27]
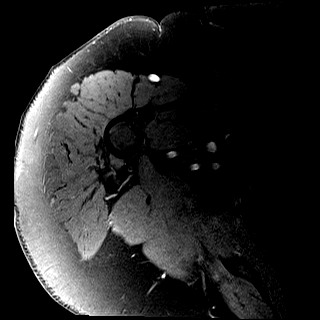
[im 7/27]
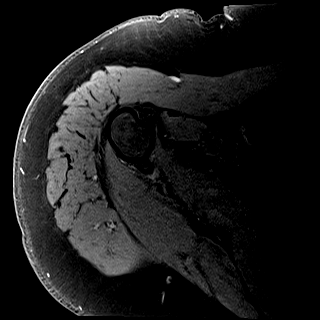
[im 14/27]
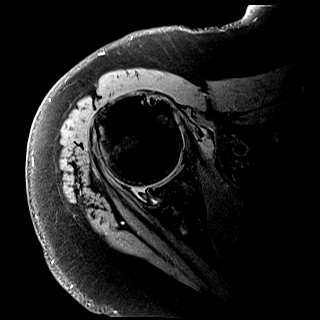
[im 20/27]
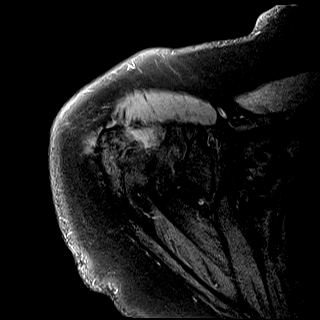
[im 27/27]
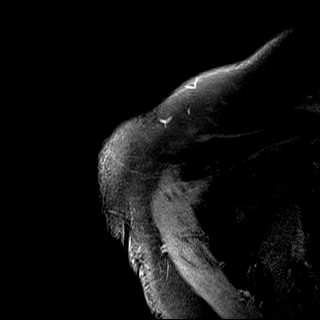

[Series 12: T1 fat-sat post-contrast · axial · right · 3.0mm · 0.50mm/px · z∈[-31,+61]mm · 5 of 27 slices shown (1 of 2)]
[im 1/27]
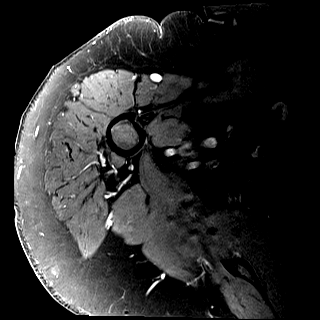
[im 7/27]
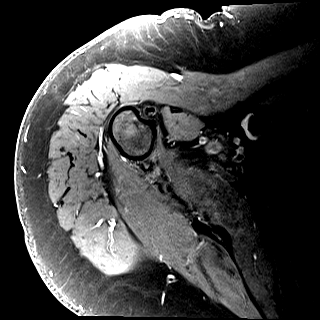
[im 14/27]
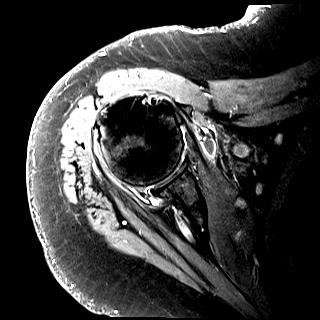
[im 20/27]
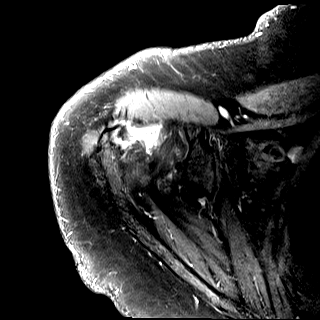
[im 27/27]
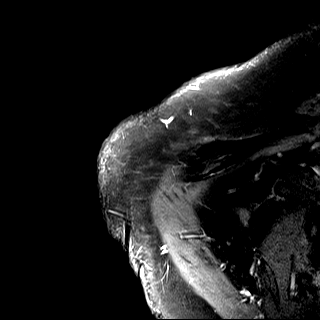

[Series 13: T1 fat-sat post-contrast · oblique · right · 3.0mm · 0.44mm/px · 5 of 26 slices shown (2 of 2)]
[im 1/26]
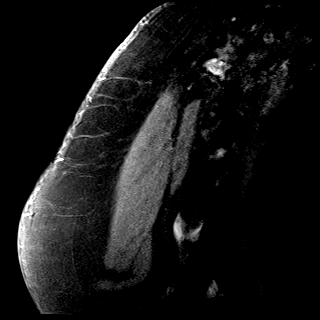
[im 7/26]
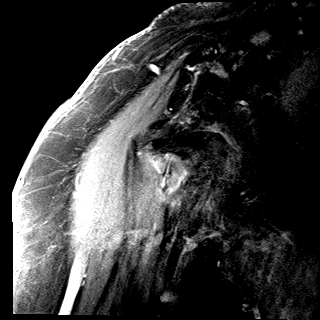
[im 13/26]
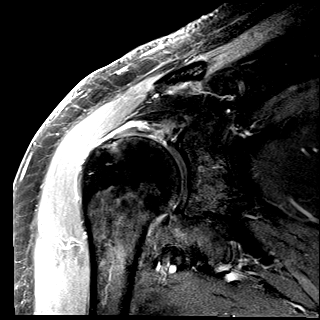
[im 19/26]
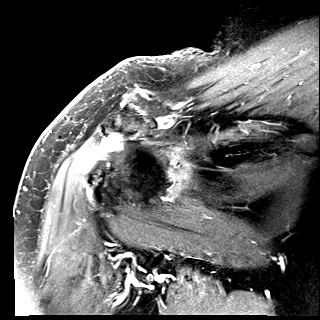
[im 26/26]
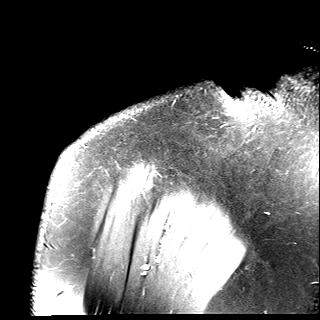

[40 of 40 positions shown; findings below may reference images not displayed]

FINDINGS: Rotator cuff: Large full-thickness retracted tear of the
supraspinatus tendon. Maximum retraction is 3.5 cm. The tear is
approximately 2 cm wide. Some of the anterior most fibers are still
intact. The infraspinatus and subscapularis tendons are intact.
Moderate tendinopathy the and interstitial tears.

Muscles: Mild diffuse fatty atrophy of the shoulder musculature.
This is most pronounced involving the supraspinatus and
infraspinatus muscles.

Biceps long head: Intact. Tendinopathy involving the intra-articular
portion.

Acromioclavicular Joint: Moderate to advanced degenerative changes.
There is fluid in the joint with mild typical rim like synovial
enhancement. This does bulge superiorly and may be responsible for
the patient's palpable abnormality. The joint may communicate with
the subacromial/subdeltoid bursa. Type 1-2 acromion. No significant
lateral downsloping but there is subacromial spurring.

Glenohumeral Joint: Mild degenerative changes. Small joint effusion.

Labrum:  No obvious labral tear.

Bones:  No acute bony findings.

Other: Expected fluid in the subacromial/subdeltoid bursa.
IMPRESSION: 1. Large full-thickness retracted tear of the supraspinatus tendon
as detailed above.
2. Moderate infraspinatus and subscapularis tendinopathy and
interstitial tears.
3. Intact long head biceps tendon and glenoid labrum.
4. Moderate to advanced AC joint degenerative changes cephalad
bulging fluid and pannus in the joint likely accounting for the
patient's palpable abnormality.

## 2022-10-25 DIAGNOSIS — E785 Hyperlipidemia, unspecified: Secondary | ICD-10-CM | POA: Diagnosis not present

## 2022-10-25 DIAGNOSIS — E118 Type 2 diabetes mellitus with unspecified complications: Secondary | ICD-10-CM | POA: Diagnosis not present

## 2022-10-26 DIAGNOSIS — Z961 Presence of intraocular lens: Secondary | ICD-10-CM | POA: Diagnosis not present

## 2022-10-26 DIAGNOSIS — H35372 Puckering of macula, left eye: Secondary | ICD-10-CM | POA: Diagnosis not present

## 2022-10-26 DIAGNOSIS — H18413 Arcus senilis, bilateral: Secondary | ICD-10-CM | POA: Diagnosis not present

## 2022-10-26 DIAGNOSIS — H04123 Dry eye syndrome of bilateral lacrimal glands: Secondary | ICD-10-CM | POA: Diagnosis not present

## 2022-10-26 DIAGNOSIS — H16223 Keratoconjunctivitis sicca, not specified as Sjogren's, bilateral: Secondary | ICD-10-CM | POA: Diagnosis not present

## 2022-10-31 DIAGNOSIS — K5900 Constipation, unspecified: Secondary | ICD-10-CM | POA: Diagnosis not present

## 2022-10-31 DIAGNOSIS — M5090 Cervical disc disorder, unspecified, unspecified cervical region: Secondary | ICD-10-CM | POA: Diagnosis not present

## 2022-10-31 DIAGNOSIS — G720 Drug-induced myopathy: Secondary | ICD-10-CM | POA: Diagnosis not present

## 2022-10-31 DIAGNOSIS — Z789 Other specified health status: Secondary | ICD-10-CM | POA: Diagnosis not present

## 2022-10-31 DIAGNOSIS — M19019 Primary osteoarthritis, unspecified shoulder: Secondary | ICD-10-CM | POA: Diagnosis not present

## 2022-10-31 DIAGNOSIS — I10 Essential (primary) hypertension: Secondary | ICD-10-CM | POA: Diagnosis not present

## 2022-10-31 DIAGNOSIS — E118 Type 2 diabetes mellitus with unspecified complications: Secondary | ICD-10-CM | POA: Diagnosis not present

## 2022-10-31 DIAGNOSIS — E785 Hyperlipidemia, unspecified: Secondary | ICD-10-CM | POA: Diagnosis not present

## 2022-10-31 DIAGNOSIS — M51369 Other intervertebral disc degeneration, lumbar region without mention of lumbar back pain or lower extremity pain: Secondary | ICD-10-CM | POA: Diagnosis not present

## 2022-11-14 DIAGNOSIS — Z1231 Encounter for screening mammogram for malignant neoplasm of breast: Secondary | ICD-10-CM | POA: Diagnosis not present

## 2022-12-08 DIAGNOSIS — Z1231 Encounter for screening mammogram for malignant neoplasm of breast: Secondary | ICD-10-CM | POA: Diagnosis not present

## 2022-12-08 IMAGING — CR DG SHOULDER 2+V PORT*R*
1 series · 1 of 1 positions shown · non-contrast
Comparison: Plain films right shoulder 01/14/2020.

CLINICAL DATA: Status post right shoulder replacement.

EXAM:
PORTABLE RIGHT SHOULDER

[shoulder ap]
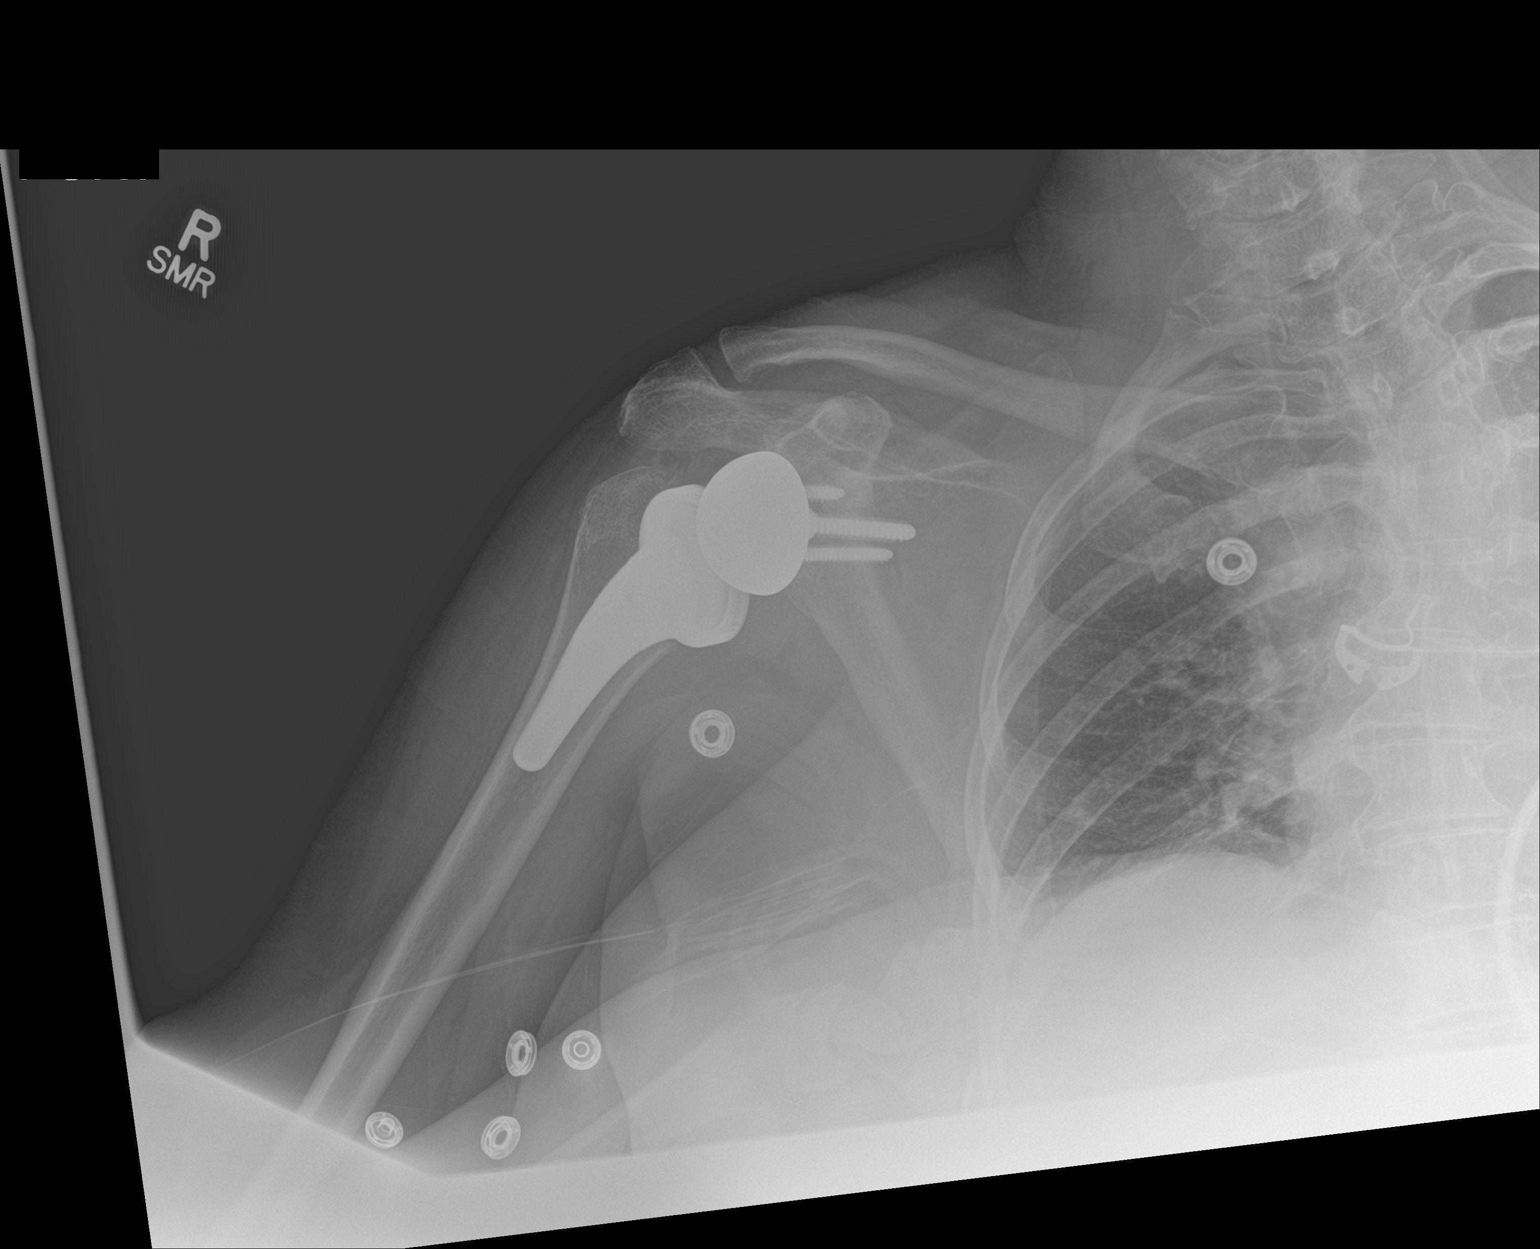

[1 of 1 positions shown; findings below may reference images not displayed]

FINDINGS: Reverse arthroplasty is in place. No acute abnormality is
identified.
IMPRESSION: Status post reverse right shoulder replacement.  No acute finding.

## 2023-01-21 DIAGNOSIS — R051 Acute cough: Secondary | ICD-10-CM | POA: Diagnosis not present

## 2023-01-21 DIAGNOSIS — R0981 Nasal congestion: Secondary | ICD-10-CM | POA: Diagnosis not present

## 2023-04-24 DIAGNOSIS — E118 Type 2 diabetes mellitus with unspecified complications: Secondary | ICD-10-CM | POA: Diagnosis not present

## 2023-04-24 DIAGNOSIS — E785 Hyperlipidemia, unspecified: Secondary | ICD-10-CM | POA: Diagnosis not present

## 2023-04-24 DIAGNOSIS — I10 Essential (primary) hypertension: Secondary | ICD-10-CM | POA: Diagnosis not present

## 2023-04-24 DIAGNOSIS — R635 Abnormal weight gain: Secondary | ICD-10-CM | POA: Diagnosis not present

## 2023-04-30 DIAGNOSIS — M51362 Other intervertebral disc degeneration, lumbar region with discogenic back pain and lower extremity pain: Secondary | ICD-10-CM | POA: Diagnosis not present

## 2023-04-30 DIAGNOSIS — E785 Hyperlipidemia, unspecified: Secondary | ICD-10-CM | POA: Diagnosis not present

## 2023-04-30 DIAGNOSIS — I10 Essential (primary) hypertension: Secondary | ICD-10-CM | POA: Diagnosis not present

## 2023-04-30 DIAGNOSIS — R42 Dizziness and giddiness: Secondary | ICD-10-CM | POA: Diagnosis not present

## 2023-04-30 DIAGNOSIS — Z0000 Encounter for general adult medical examination without abnormal findings: Secondary | ICD-10-CM | POA: Diagnosis not present

## 2023-04-30 DIAGNOSIS — E118 Type 2 diabetes mellitus with unspecified complications: Secondary | ICD-10-CM | POA: Diagnosis not present

## 2023-04-30 DIAGNOSIS — M5416 Radiculopathy, lumbar region: Secondary | ICD-10-CM | POA: Diagnosis not present

## 2023-04-30 DIAGNOSIS — K219 Gastro-esophageal reflux disease without esophagitis: Secondary | ICD-10-CM | POA: Diagnosis not present

## 2023-04-30 DIAGNOSIS — G720 Drug-induced myopathy: Secondary | ICD-10-CM | POA: Diagnosis not present

## 2023-05-09 DIAGNOSIS — E785 Hyperlipidemia, unspecified: Secondary | ICD-10-CM | POA: Diagnosis not present

## 2023-05-09 DIAGNOSIS — E118 Type 2 diabetes mellitus with unspecified complications: Secondary | ICD-10-CM | POA: Diagnosis not present

## 2023-05-09 DIAGNOSIS — R42 Dizziness and giddiness: Secondary | ICD-10-CM | POA: Diagnosis not present

## 2023-05-09 DIAGNOSIS — Z8249 Family history of ischemic heart disease and other diseases of the circulatory system: Secondary | ICD-10-CM | POA: Diagnosis not present

## 2023-05-09 DIAGNOSIS — R5381 Other malaise: Secondary | ICD-10-CM | POA: Diagnosis not present

## 2023-05-09 DIAGNOSIS — I10 Essential (primary) hypertension: Secondary | ICD-10-CM | POA: Diagnosis not present

## 2023-05-23 DIAGNOSIS — Z789 Other specified health status: Secondary | ICD-10-CM | POA: Diagnosis not present

## 2023-05-23 DIAGNOSIS — Z136 Encounter for screening for cardiovascular disorders: Secondary | ICD-10-CM | POA: Diagnosis not present

## 2023-05-23 DIAGNOSIS — R931 Abnormal findings on diagnostic imaging of heart and coronary circulation: Secondary | ICD-10-CM | POA: Diagnosis not present

## 2023-05-23 DIAGNOSIS — R0609 Other forms of dyspnea: Secondary | ICD-10-CM | POA: Diagnosis not present

## 2023-05-23 DIAGNOSIS — R5383 Other fatigue: Secondary | ICD-10-CM | POA: Diagnosis not present

## 2023-05-23 DIAGNOSIS — E785 Hyperlipidemia, unspecified: Secondary | ICD-10-CM | POA: Diagnosis not present

## 2023-05-23 DIAGNOSIS — I10 Essential (primary) hypertension: Secondary | ICD-10-CM | POA: Diagnosis not present

## 2023-05-23 DIAGNOSIS — Z7689 Persons encountering health services in other specified circumstances: Secondary | ICD-10-CM | POA: Diagnosis not present

## 2023-08-08 ENCOUNTER — Ambulatory Visit: Admitting: Podiatry

## 2023-08-20 DIAGNOSIS — R0609 Other forms of dyspnea: Secondary | ICD-10-CM | POA: Diagnosis not present

## 2023-08-22 ENCOUNTER — Ambulatory Visit: Admitting: Podiatry

## 2023-08-22 ENCOUNTER — Ambulatory Visit (INDEPENDENT_AMBULATORY_CARE_PROVIDER_SITE_OTHER)

## 2023-08-22 DIAGNOSIS — M778 Other enthesopathies, not elsewhere classified: Secondary | ICD-10-CM

## 2023-08-22 DIAGNOSIS — M7752 Other enthesopathy of left foot: Secondary | ICD-10-CM | POA: Diagnosis not present

## 2023-08-22 DIAGNOSIS — M722 Plantar fascial fibromatosis: Secondary | ICD-10-CM | POA: Diagnosis not present

## 2023-08-22 DIAGNOSIS — G5753 Tarsal tunnel syndrome, bilateral lower limbs: Secondary | ICD-10-CM

## 2023-08-22 DIAGNOSIS — M5432 Sciatica, left side: Secondary | ICD-10-CM | POA: Diagnosis not present

## 2023-08-22 DIAGNOSIS — M7751 Other enthesopathy of right foot: Secondary | ICD-10-CM

## 2023-08-22 MED ORDER — AMITRIPTYLINE HCL 25 MG PO TABS: 25.0000 mg | ORAL_TABLET | Freq: Every day | ORAL | 1 refills | Status: AC

## 2023-08-22 NOTE — Progress Notes (Signed)
 Chief Complaint  Patient presents with   Foot Pain    Bilateral foot pain, the whole fore foot and lateral sides. Pain with burning and tingling, HX of sciatica, Not diabetic and no anti coag   HPI: 78 y.o. female presents today with concern of decreased sensation to her forefoot bilateral.  She notes a history of bunions.  She notes that she has pain, along with the numbness.  The numbness is mostly in the ball of the foot.  She has a history of sciatica in the left lower extremity.  States that she is not currently seeing any specialist for this and does not perform exercises daily.  States gabapentin has not been helpful for her in the past.  Past Medical History:  Diagnosis Date   Adenomatous polyps    Allergy    Anemia    with pregnancy    Arthritis    GERD (gastroesophageal reflux disease)    History of colon polyps    History of COVID-19    received infusion therapy   Hyperlipidemia    Hypertension    IBS (irritable bowel syndrome)    alternates with constipation and loose stools- takes fiber daily    Postmenopause    Prediabetes    controlled with diet and exercise   Past Surgical History:  Procedure Laterality Date   ABDOMINAL HYSTERECTOMY     left ovaries    bladder tac     BREAST BIOPSY     due to bening reasons   CATARACT EXTRACTION, BILATERAL     CHOLECYSTECTOMY     COLONOSCOPY  01/17/2016   Colonic polyps status post polypectomy. Pancolonic diverticulosis predominantly in the sigmoid colon. 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     Physical Exam: Palpable pedal pulses.  No open lesions are noted.  No ecchymosis erythema or edema is appreciated.  No pain to the bunions on palpation noted.  Decreased first MPJ range of motion bilateral without crepitus.  Ankle dorsiflexion is slightly less than 10 degrees with the knee extended bilateral.  The medial and central plantar fascial bands are very tight on palpation.  Manual muscle testing 5/5.  Protective sensation intact bilateral with Semmes Weinstein monofilament.  Negative Tinel's sign with percussion of the posterior tibial nerve on the right.  Positive Tinel's sign of the posterior tibial nerve on the left (this is also the extremity that has the sciatica).  Radiographic Exam (bilateral foot, 3 weightbearing views, 08/22/2023):  Normal osseous mineralization. No fractures noted.  Slight splayfoot bilateral.  Increase in first intermetatarsal angle and hallux abductus angle.  Uneven joint space narrowing at the first MPJ bilaterally.  Increased met adductus angle bilateral.  Inferior calcaneal spur bilateral.  Assessment/Plan of Care: 1. 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 Capsulitis  of right foot   3. Capsulitis of left foot   4. Sciatica of left side   5. Plantar fasciitis      Meds ordered this encounter  Medications   amitriptyline  (ELAVIL ) 25 MG tablet    Sig: Take 1 tablet (25 mg total) by mouth at bedtime.    Dispense:  30 tablet    Refill:  1   AMB REFERRAL TO NEUROLOGY -will get her set up for an nerve conduction studies of the bilateral lower extremity.  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  In the meantime we will get her started with amitriptyline  25 mg 1 tablet nightly.  She needs to stop the gabapentin.  She will try this  for 30 days and give a call to let us  know how she is feeling and if the medication is helping.  Follow-up  She was informed that her plantar fascia is extremely tight bilateral.  Will get her set up for a referral to [facility] to evaluate and treat for the next 5 weeks.  Follow-up with phone call to the patient after her EMG/NCV.   Awanda CHARM Imperial, DPM, FACFAS [facility]     [address] [place], [place] [address]                Office [phone]  Fax [phone]

## 2023-08-28 ENCOUNTER — Other Ambulatory Visit: Payer: Self-pay

## 2023-08-28 ENCOUNTER — Encounter: Payer: Self-pay | Admitting: Neurology

## 2023-08-28 DIAGNOSIS — R202 Paresthesia of skin: Secondary | ICD-10-CM

## 2023-09-10 DIAGNOSIS — M545 Low back pain, unspecified: Secondary | ICD-10-CM | POA: Diagnosis not present

## 2023-09-10 DIAGNOSIS — M79671 Pain in right foot: Secondary | ICD-10-CM | POA: Diagnosis not present

## 2023-09-10 DIAGNOSIS — M79672 Pain in left foot: Secondary | ICD-10-CM | POA: Diagnosis not present

## 2023-09-10 DIAGNOSIS — R2689 Other abnormalities of gait and mobility: Secondary | ICD-10-CM | POA: Diagnosis not present

## 2023-09-14 DIAGNOSIS — M79671 Pain in right foot: Secondary | ICD-10-CM | POA: Diagnosis not present

## 2023-09-14 DIAGNOSIS — R2689 Other abnormalities of gait and mobility: Secondary | ICD-10-CM | POA: Diagnosis not present

## 2023-09-14 DIAGNOSIS — M79672 Pain in left foot: Secondary | ICD-10-CM | POA: Diagnosis not present

## 2023-09-14 DIAGNOSIS — M545 Low back pain, unspecified: Secondary | ICD-10-CM | POA: Diagnosis not present

## 2023-09-17 DIAGNOSIS — M79672 Pain in left foot: Secondary | ICD-10-CM | POA: Diagnosis not present

## 2023-09-17 DIAGNOSIS — R2689 Other abnormalities of gait and mobility: Secondary | ICD-10-CM | POA: Diagnosis not present

## 2023-09-17 DIAGNOSIS — M79671 Pain in right foot: Secondary | ICD-10-CM | POA: Diagnosis not present

## 2023-09-17 DIAGNOSIS — M545 Low back pain, unspecified: Secondary | ICD-10-CM | POA: Diagnosis not present

## 2023-09-24 DIAGNOSIS — M79671 Pain in right foot: Secondary | ICD-10-CM | POA: Diagnosis not present

## 2023-09-24 DIAGNOSIS — M79672 Pain in left foot: Secondary | ICD-10-CM | POA: Diagnosis not present

## 2023-09-24 DIAGNOSIS — M545 Low back pain, unspecified: Secondary | ICD-10-CM | POA: Diagnosis not present

## 2023-09-24 DIAGNOSIS — R2689 Other abnormalities of gait and mobility: Secondary | ICD-10-CM | POA: Diagnosis not present

## 2023-10-01 DIAGNOSIS — M79671 Pain in right foot: Secondary | ICD-10-CM | POA: Diagnosis not present

## 2023-10-01 DIAGNOSIS — M79672 Pain in left foot: Secondary | ICD-10-CM | POA: Diagnosis not present

## 2023-10-01 DIAGNOSIS — R2689 Other abnormalities of gait and mobility: Secondary | ICD-10-CM | POA: Diagnosis not present

## 2023-10-01 DIAGNOSIS — M545 Low back pain, unspecified: Secondary | ICD-10-CM | POA: Diagnosis not present

## 2023-10-09 DIAGNOSIS — R2689 Other abnormalities of gait and mobility: Secondary | ICD-10-CM | POA: Diagnosis not present

## 2023-10-09 DIAGNOSIS — M79672 Pain in left foot: Secondary | ICD-10-CM | POA: Diagnosis not present

## 2023-10-09 DIAGNOSIS — M79671 Pain in right foot: Secondary | ICD-10-CM | POA: Diagnosis not present

## 2023-10-09 DIAGNOSIS — M545 Low back pain, unspecified: Secondary | ICD-10-CM | POA: Diagnosis not present

## 2023-10-15 DIAGNOSIS — R2689 Other abnormalities of gait and mobility: Secondary | ICD-10-CM | POA: Diagnosis not present

## 2023-10-15 DIAGNOSIS — M79672 Pain in left foot: Secondary | ICD-10-CM | POA: Diagnosis not present

## 2023-10-15 DIAGNOSIS — M545 Low back pain, unspecified: Secondary | ICD-10-CM | POA: Diagnosis not present

## 2023-10-15 DIAGNOSIS — M79671 Pain in right foot: Secondary | ICD-10-CM | POA: Diagnosis not present

## 2023-10-23 ENCOUNTER — Ambulatory Visit (INDEPENDENT_AMBULATORY_CARE_PROVIDER_SITE_OTHER): Admitting: Neurology

## 2023-10-23 DIAGNOSIS — E118 Type 2 diabetes mellitus with unspecified complications: Secondary | ICD-10-CM | POA: Diagnosis not present

## 2023-10-23 DIAGNOSIS — R202 Paresthesia of skin: Secondary | ICD-10-CM

## 2023-10-23 DIAGNOSIS — E785 Hyperlipidemia, unspecified: Secondary | ICD-10-CM | POA: Diagnosis not present

## 2023-10-23 NOTE — Procedures (Signed)
 [facility]  [address] [place], [address]  [place], [place] [address] Tel: [phone] Fax: [phone] Test Date:  10/23/2023  Patient: Amanda Merritt DOB: 08/22/1945 Physician: Venetia Potters, MD  Sex: Female Height: 5' 5 Ref Phys: Awanda Imperial, [place]  ID#: [id number]   Technician:    History: This is a 77 year old female with pain and numbness in her feet.  NCV & EMG Findings: Extensive electrodiagnostic evaluation of bilateral lower limbs shows: Bilateral sural and superficial peroneal/fibular sensory responses are within normal limits. Right peroneal/fibular (EDB) motor response shows reduced amplitude (1.27 mV). Right peroneal/fibular (TA), left peroneal/fibular (EDB), and bilateral tibial (AH) motor responses are within normal limits. Bilateral H reflexes are absent. There is no evidence of active or chronic motor axon loss changes affecting any of the tested muscles on needle examination. Motor unit configuration and recruitment pattern is within normal limits.  Impression: This study shows no significant abnormalities. Specifically: No definitive electrodiagnostic evidence of a right or left lumbosacral (L3-S1) motor radiculopathy. No definitive electrodiagnostic evidence of right or left tarsal tunnel syndrome. No electrodiagnostic evidence of a large fiber sensorimotor neuropathy. Absent bilateral H reflex may be normal for age. Low amplitude right peroneal/fibular (EDB) motor response in the setting of a normal needle examination is of unclear clinical significance and the finding is too limited in degree and distribution for definitive diagnosis. It may also be technical in nature.    ___________________________ Venetia Potters, MD    Nerve Conduction Studies Motor Nerve Results    Latency Amplitude F-Lat Segment Distance CV Comment  Site (ms) Norm (mV) Norm (ms)  (cm) (m/s) Norm   Left Fibular (EDB) Motor  Ankle 3.1  < 6.0 4.1  > 2.5        Bel fib head  9.8 - 3.3 -  Bel fib head-Ankle 28.5 43  > 40   Pop fossa 11.6 - 3.3 -  Pop fossa-Bel fib head 9 50 -   Right Fibular (EDB) Motor  Ankle 3.8  < 6.0 *1.27  > 2.5        Bel fib head 11.0 - 1.01 -  Bel fib head-Ankle 31 43  > 40   Pop fossa 12.7 - 0.91 -  Pop fossa-Bel fib head 8 47 -   Right Fibular (TA) Motor  Fib head 1.90  < 4.5 5.0  > 3.0        Pop fossa 3.2  < 6.7 5.0 -  Pop fossa-Fib head 8 62  > 40   Left Tibial (AH) Motor  Ankle 3.6  < 6.0 6.3  > 4.0        Knee 12.7 - 5.1 -  Knee-Ankle 37 41  > 40   Right Tibial (AH) Motor  Ankle 3.0  < 6.0 8.1  > 4.0        Knee 12.3 - 5.7 -  Knee-Ankle 40 43  > 40    Sensory Sites    Neg Peak Lat Amplitude (O-P) Segment Distance Velocity Comment  Site (ms) Norm (V) Norm  (cm) (ms)   Left Superficial Fibular Sensory  14 cm-Ankle 2.9  < 4.6 3  > 3 14 cm-Ankle 14    Right Superficial Fibular Sensory  14 cm-Ankle 2.7  < 4.6 5  > 3 14 cm-Ankle 14    Left Sural Sensory  Calf-Lat mall 3.5  < 4.6 4  > 3 Calf-Lat mall 14    Right Sural Sensory  Calf-Lat mall  4.1  < 4.6 5  > 3 Calf-Lat mall 14     H-Reflex Results    M-Lat H Lat H Neg Amp H-M Lat  Site (ms) (ms) Norm (mV) (ms)  Left Tibial H-Reflex  Pop fossa 6.6 -  < 35.0 - -  Right Tibial H-Reflex  Pop fossa 7.1 -  < 35.0 - -   Electromyography   Side Muscle Ins.Act Fibs Fasc Recrt Amp Dur Poly Activation Comment  Left Tib ant Nml Nml Nml Nml Nml Nml Nml Nml N/A  Left Gastroc MH Nml Nml Nml Nml Nml Nml Nml Nml N/A  Left Rectus fem Nml Nml Nml Nml Nml Nml Nml Nml N/A  Left Biceps fem SH Nml Nml Nml Nml Nml Nml Nml Nml N/A  Left Gluteus med Nml Nml Nml Nml Nml Nml Nml Nml N/A  Right Tib ant Nml Nml Nml Nml Nml Nml Nml Nml N/A  Right Gastroc MH Nml Nml Nml Nml Nml Nml Nml Nml N/A  Right Rectus fem Nml Nml Nml Nml Nml Nml Nml Nml N/A  Right Biceps fem SH Nml Nml Nml Nml Nml Nml Nml Nml N/A  Right Gluteus med Nml Nml Nml Nml Nml Nml Nml Nml N/A      Waveforms:  Motor              Sensory           H-Reflex

## 2023-10-26 ENCOUNTER — Telehealth: Payer: Self-pay | Admitting: Neurology

## 2023-10-26 NOTE — Telephone Encounter (Signed)
 Lillia(Self) lvm regarding test results.She stated that results are on Mychart, but when she goes to pull them up there is no information. The Foot doctor said they have not received results. Shalunda stated that she was informed that the results would be sent Tuesday evening.

## 2023-10-26 NOTE — Telephone Encounter (Signed)
 Called patient and informed her that I will re-fax her EMG orders to Dr. Loel. Patient verbalized understanding and had no further questions or concerns.

## 2023-11-01 ENCOUNTER — Ambulatory Visit: Payer: Self-pay | Admitting: Podiatry

## 2023-11-01 DIAGNOSIS — I10 Essential (primary) hypertension: Secondary | ICD-10-CM | POA: Diagnosis not present

## 2023-11-01 DIAGNOSIS — M51362 Other intervertebral disc degeneration, lumbar region with discogenic back pain and lower extremity pain: Secondary | ICD-10-CM | POA: Diagnosis not present

## 2023-11-01 DIAGNOSIS — K219 Gastro-esophageal reflux disease without esophagitis: Secondary | ICD-10-CM | POA: Diagnosis not present

## 2023-11-01 DIAGNOSIS — R946 Abnormal results of thyroid function studies: Secondary | ICD-10-CM | POA: Diagnosis not present

## 2023-11-01 DIAGNOSIS — E785 Hyperlipidemia, unspecified: Secondary | ICD-10-CM | POA: Diagnosis not present

## 2023-11-01 DIAGNOSIS — E118 Type 2 diabetes mellitus with unspecified complications: Secondary | ICD-10-CM | POA: Diagnosis not present

## 2023-11-01 DIAGNOSIS — Z79899 Other long term (current) drug therapy: Secondary | ICD-10-CM | POA: Diagnosis not present

## 2023-11-01 DIAGNOSIS — G720 Drug-induced myopathy: Secondary | ICD-10-CM | POA: Diagnosis not present

## 2023-11-01 DIAGNOSIS — E559 Vitamin D deficiency, unspecified: Secondary | ICD-10-CM | POA: Diagnosis not present

## 2023-11-30 DIAGNOSIS — E785 Hyperlipidemia, unspecified: Secondary | ICD-10-CM | POA: Diagnosis not present

## 2023-11-30 DIAGNOSIS — I10 Essential (primary) hypertension: Secondary | ICD-10-CM | POA: Diagnosis not present

## 2023-11-30 DIAGNOSIS — R931 Abnormal findings on diagnostic imaging of heart and coronary circulation: Secondary | ICD-10-CM | POA: Diagnosis not present
